# Patient Record
Sex: Female | Born: 1995 | Race: White | Hispanic: No | Marital: Single | Smoking: Current every day smoker
Health system: Southern US, Community
[De-identification: ages and names within clinical notes are randomized; demographics above are authoritative.]

## PROBLEM LIST (undated history)

## (undated) ENCOUNTER — Inpatient Hospital Stay (HOSPITAL_COMMUNITY): Payer: Self-pay

## (undated) DIAGNOSIS — G43909 Migraine, unspecified, not intractable, without status migrainosus: Secondary | ICD-10-CM

## (undated) DIAGNOSIS — G930 Cerebral cysts: Secondary | ICD-10-CM

## (undated) DIAGNOSIS — F419 Anxiety disorder, unspecified: Secondary | ICD-10-CM

## (undated) DIAGNOSIS — G935 Compression of brain: Secondary | ICD-10-CM

## (undated) DIAGNOSIS — O133 Gestational [pregnancy-induced] hypertension without significant proteinuria, third trimester: Secondary | ICD-10-CM

## (undated) DIAGNOSIS — I10 Essential (primary) hypertension: Secondary | ICD-10-CM

## (undated) HISTORY — DX: Essential (primary) hypertension: I10

## (undated) HISTORY — PX: NO PAST SURGERIES: SHX2092

## (undated) HISTORY — DX: Gestational (pregnancy-induced) hypertension without significant proteinuria, third trimester: O13.3

---

## 2017-08-16 ENCOUNTER — Inpatient Hospital Stay (HOSPITAL_COMMUNITY)
Admission: AD | Admit: 2017-08-16 | Discharge: 2017-08-17 | Disposition: A | Discharge status: Home / Self Care | Payer: BLUE CROSS/BLUE SHIELD | Source: Ambulatory Visit | Attending: Obstetrics and Gynecology | Admitting: Obstetrics and Gynecology

## 2017-08-16 ENCOUNTER — Encounter (HOSPITAL_COMMUNITY): Payer: Self-pay

## 2017-08-16 ENCOUNTER — Emergency Department (HOSPITAL_COMMUNITY): Payer: BLUE CROSS/BLUE SHIELD

## 2017-08-16 ENCOUNTER — Encounter (HOSPITAL_COMMUNITY): Payer: Self-pay | Admitting: Emergency Medicine

## 2017-08-16 ENCOUNTER — Emergency Department (HOSPITAL_COMMUNITY)
Admission: EM | Admit: 2017-08-16 | Discharge: 2017-08-17 | Disposition: A | Discharge status: Home / Self Care | Payer: Self-pay | Attending: Emergency Medicine | Admitting: Emergency Medicine

## 2017-08-16 DIAGNOSIS — F419 Anxiety disorder, unspecified: Secondary | ICD-10-CM | POA: Diagnosis not present

## 2017-08-16 DIAGNOSIS — Z3A01 Less than 8 weeks gestation of pregnancy: Secondary | ICD-10-CM | POA: Insufficient documentation

## 2017-08-16 DIAGNOSIS — O99331 Smoking (tobacco) complicating pregnancy, first trimester: Secondary | ICD-10-CM | POA: Insufficient documentation

## 2017-08-16 DIAGNOSIS — F17210 Nicotine dependence, cigarettes, uncomplicated: Secondary | ICD-10-CM | POA: Diagnosis not present

## 2017-08-16 DIAGNOSIS — Z3491 Encounter for supervision of normal pregnancy, unspecified, first trimester: Secondary | ICD-10-CM

## 2017-08-16 DIAGNOSIS — O208 Other hemorrhage in early pregnancy: Secondary | ICD-10-CM | POA: Insufficient documentation

## 2017-08-16 DIAGNOSIS — Z5321 Procedure and treatment not carried out due to patient leaving prior to being seen by health care provider: Secondary | ICD-10-CM | POA: Insufficient documentation

## 2017-08-16 DIAGNOSIS — O418X10 Other specified disorders of amniotic fluid and membranes, first trimester, not applicable or unspecified: Secondary | ICD-10-CM

## 2017-08-16 DIAGNOSIS — O468X1 Other antepartum hemorrhage, first trimester: Secondary | ICD-10-CM

## 2017-08-16 DIAGNOSIS — O99341 Other mental disorders complicating pregnancy, first trimester: Secondary | ICD-10-CM | POA: Diagnosis not present

## 2017-08-16 DIAGNOSIS — O209 Hemorrhage in early pregnancy, unspecified: Secondary | ICD-10-CM

## 2017-08-16 DIAGNOSIS — Z79899 Other long term (current) drug therapy: Secondary | ICD-10-CM | POA: Insufficient documentation

## 2017-08-16 HISTORY — DX: Anxiety disorder, unspecified: F41.9

## 2017-08-16 HISTORY — DX: Migraine, unspecified, not intractable, without status migrainosus: G43.909

## 2017-08-16 LAB — CBC
HCT: 39.9 % (ref 36.0–46.0)
Hemoglobin: 13.4 g/dL (ref 12.0–15.0)
MCH: 29 pg (ref 26.0–34.0)
MCHC: 33.6 g/dL (ref 30.0–36.0)
MCV: 86.4 fL (ref 78.0–100.0)
Platelets: 376 10*3/uL (ref 150–400)
RBC: 4.62 MIL/uL (ref 3.87–5.11)
RDW: 13.1 % (ref 11.5–15.5)
WBC: 12.4 10*3/uL — ABNORMAL HIGH (ref 4.0–10.5)

## 2017-08-16 LAB — I-STAT BETA HCG BLOOD, ED (MC, WL, AP ONLY): I-stat hCG, quantitative: >2000 m[IU]/mL — ABNORMAL HIGH (ref <5)

## 2017-08-16 NOTE — ED Triage Notes (Signed)
Patient states she is [redacted] weeks pregnant and began having vaginal bleeding around 7pm tonight. States small amount of blood on underwear. Denies any pain. First time being pregnant. Saw PCP on 5th and has not seen OB yet. appt scheduled on Monday.

## 2017-08-16 NOTE — MAU Note (Signed)
Pt reports red/pink vaginal bleeding that started tonight around 7pm. States it is like a period. Pt denies pain. Pt denies recent pelvic exam. Last intercourse several days ago. LMP 06/29/2017

## 2017-08-17 DIAGNOSIS — O209 Hemorrhage in early pregnancy, unspecified: Secondary | ICD-10-CM | POA: Diagnosis not present

## 2017-08-17 LAB — URINALYSIS, ROUTINE W REFLEX MICROSCOPIC
BILIRUBIN URINE: NEGATIVE
Glucose, UA: NEGATIVE mg/dL
Ketones, ur: NEGATIVE mg/dL
Leukocytes, UA: NEGATIVE
NITRITE: NEGATIVE
PROTEIN: 100 mg/dL — AB
Specific Gravity, Urine: 1.014 (ref 1.005–1.030)
pH: 6 (ref 5.0–8.0)

## 2017-08-17 LAB — ABO/RH: ABO/RH(D): AB POS

## 2017-08-17 LAB — HCG, QUANTITATIVE, PREGNANCY: HCG, BETA CHAIN, QUANT, S: 40706 m[IU]/mL — AB (ref <5)

## 2017-08-17 LAB — HIV ANTIBODY (ROUTINE TESTING W REFLEX): HIV Screen 4th Generation wRfx: NONREACTIVE

## 2017-08-17 NOTE — MAU Provider Note (Signed)
Chief Complaint: Vaginal Bleeding   None     SUBJECTIVE HPI: Charlene Schneider is a 22 y.o. G1P0 at [redacted]w[redacted]d by LMP who presents to maternity admissions reporting vaginal bleeding like a light period starting tonight. The bleeding is light red, and is not enough to soak a pad fully, but needs more than a panty liner. She denies recent pelvic exam or intercourse.  She has not tried any treatments. There are no other associated symptoms.     HPI  Past Medical History:  Diagnosis Date  . Anxiety   . Migraines    History reviewed. No pertinent surgical history. Social History   Socioeconomic History  . Marital status: Single    Spouse name: Not on file  . Number of children: Not on file  . Years of education: Not on file  . Highest education level: Not on file  Social Needs  . Financial resource strain: Not on file  . Food insecurity - worry: Not on file  . Food insecurity - inability: Not on file  . Transportation needs - medical: Not on file  . Transportation needs - non-medical: Not on file  Occupational History  . Not on file  Tobacco Use  . Smoking status: Current Every Day Smoker    Types: Cigarettes  . Smokeless tobacco: Never Used  Substance and Sexual Activity  . Alcohol use: No    Frequency: Never  . Drug use: No  . Sexual activity: Yes  Other Topics Concern  . Not on file  Social History Narrative  . Not on file   No current facility-administered medications on file prior to encounter.    Current Outpatient Medications on File Prior to Encounter  Medication Sig Dispense Refill  . Prenatal Vit-Fe Fumarate-FA (PRENATAL MULTIVITAMIN) TABS tablet Take 1 tablet by mouth daily at 12 noon.     No Known Allergies  ROS:  Review of Systems  Constitutional: Negative for chills, fatigue and fever.  Respiratory: Negative for shortness of breath.   Cardiovascular: Negative for chest pain.  Gastrointestinal: Negative for nausea and vomiting.  Genitourinary: Positive for  vaginal bleeding. Negative for difficulty urinating, dysuria, flank pain, pelvic pain, vaginal discharge and vaginal pain.  Neurological: Negative for dizziness and headaches.  Psychiatric/Behavioral: Negative.      I have reviewed patient's Past Medical Hx, Surgical Hx, Family Hx, Social Hx, medications and allergies.   Physical Exam   Patient Vitals for the past 24 hrs:  BP Temp Temp src Pulse Resp SpO2 Height Weight  08/17/17 0107 132/72 - - (!) 104 16 - - -  08/16/17 2233 131/85 98.7 F (37.1 C) Oral (!) 109 18 99 % - -  08/16/17 2226 - - - - - - 5\' 8"  (1.727 m) 189 lb (85.7 kg)   Constitutional: Well-developed, well-nourished female in no acute distress.  Cardiovascular: normal rate Respiratory: normal effort GI: Abd soft, non-tender. Pos BS x 4 MS: Extremities nontender, no edema, normal ROM Neurologic: Alert and oriented x 4.  GU: Neg CVAT.  PELVIC EXAM: Deferred, pt has scheduled OB appointment  LAB RESULTS Results for orders placed or performed during the hospital encounter of 08/16/17 (from the past 24 hour(s))  CBC     Status: Abnormal   Collection Time: 08/16/17 10:56 PM  Result Value Ref Range   WBC 12.4 (H) 4.0 - 10.5 K/uL   RBC 4.62 3.87 - 5.11 MIL/uL   Hemoglobin 13.4 12.0 - 15.0 g/dL   HCT 16.1 09.6 - 04.5 %  MCV 86.4 78.0 - 100.0 fL   MCH 29.0 26.0 - 34.0 pg   MCHC 33.6 30.0 - 36.0 g/dL   RDW 46.913.1 62.911.5 - 52.815.5 %   Platelets 376 150 - 400 K/uL  hCG, quantitative, pregnancy     Status: Abnormal   Collection Time: 08/16/17 10:56 PM  Result Value Ref Range   hCG, Beta Chain, Quant, S 40,706 (H) <5 mIU/mL  ABO/Rh     Status: None (Preliminary result)   Collection Time: 08/16/17 10:56 PM  Result Value Ref Range   ABO/RH(D)      AB POS Performed at [facility], [address] [place], [place] [address]     --/--/AB POS Performed at [facility], [address] [place], [place] [address]  [phone](02/22 2256)  IMAGING [place] Ob Comp Less 14  Wks  Result Date: 08/17/2017 CLINICAL DATA:  22 year old pregnant female with vaginal bleeding. LMP: 06/29/2017 corresponding to an estimated gestational age of [redacted] weeks, 6 days. EXAM: OBSTETRIC <14 WK US AND TRANSVAGINAL OB US TECHNIQUE: Both transabdominal and transvaginal ultrasound examinations were performed for complete evaluation of the gestation as well as the maternal uterus, adnexal regions, and pelvic cul-de-sac. Transvaginal technique was performed to assess early pregnancy. COMPARISON:  None. FINDINGS: Intrauterine gestational sac: Single intrauterine gestational sac. Yolk sac:  Seen Embryo:  Present Cardiac Activity: Detected Heart Rate: 140 bpm CRL: 9.6 mm   7 w   0 d                  US EDC: 04/04/2018 Subchorionic hemorrhage: Small subchorionic hemorrhage measuring 1.8 x 1.1 x 1.3 cm along the inferior gestational sac. Maternal uterus/adnexae: The maternal ovaries appear unremarkable. There is a small right ovarian corpus luteum. There is trace free fluid within the pelvis. IMPRESSION: Single live intrauterine pregnancy with an estimated gestational age of [redacted] weeks, 0 days. Small subchorionic hemorrhage Electronically Signed   By: Elgie CollardArash  Radparvar M.D.   On: 08/17/2017 00:38   [place] Ob Transvaginal  Result Date: 08/17/2017 CLINICAL DATA:  22 year old pregnant female with vaginal bleeding. LMP: 06/29/2017 corresponding to an estimated gestational age of [redacted] weeks, 6 days. EXAM: OBSTETRIC <14 WK US AND TRANSVAGINAL OB US TECHNIQUE: Both transabdominal and transvaginal ultrasound examinations were performed for complete evaluation of the gestation as well as the maternal uterus, adnexal regions, and pelvic cul-de-sac. Transvaginal technique was performed to assess early pregnancy. COMPARISON:  None. FINDINGS: Intrauterine gestational sac: Single intrauterine gestational sac. Yolk sac:  Seen Embryo:  Present Cardiac Activity: Detected Heart Rate: 140 bpm CRL: 9.6 mm   7 w   0 d                  US EDC:  04/04/2018 Subchorionic hemorrhage: Small subchorionic hemorrhage measuring 1.8 x 1.1 x 1.3 cm along the inferior gestational sac. Maternal uterus/adnexae: The maternal ovaries appear unremarkable. There is a small right ovarian corpus luteum. There is trace free fluid within the pelvis. IMPRESSION: Single live intrauterine pregnancy with an estimated gestational age of [redacted] weeks, 0 days. Small subchorionic hemorrhage Electronically Signed   By: Elgie CollardArash  Radparvar M.D.   On: 08/17/2017 00:38    MAU Management/MDM: Ordered labs and US and reviewed results.  Live IUP at 7 weeks noted on today's US with small subchorionic hemorrhage.  [facility], better prognosis with small SCH in first trimester.  Pt to keep scheduled OB care with [facility].  Return to MAU as needed for emergencies.  Pt discharged with strict bleeding precautions.  ASSESSMENT 1. Normal IUP (intrauterine pregnancy) on prenatal ultrasound, first trimester   2. Vaginal bleeding in pregnancy, first trimester   3. Subchorionic hemorrhage of placenta in first trimester, single or unspecified fetus     PLAN Discharge home Allergies as of 08/17/2017   No Known Allergies     Medication List    TAKE these medications   prenatal multivitamin Tabs tablet Take 1 tablet by mouth daily at 12 noon.      Follow-up Information    [facility], [facility] Follow up.   Why:  As scheduled, return to MAU as needed for emergencies Contact information: [address] [place] [address] [phone]           Sharen Counter Certified Nurse-Midwife 08/17/2017  1:08 AM

## 2017-08-17 NOTE — Discharge Instructions (Signed)
Subchorionic Hematoma °A subchorionic hematoma is a gathering of blood between the outer wall of the placenta and the inner wall of the womb (uterus). The placenta is the organ that connects the fetus to the wall of the uterus. The placenta performs the feeding, breathing (oxygen to the fetus), and waste removal (excretory work) of the fetus. °Subchorionic hematoma is the most common abnormality found on a result from ultrasonography done during the first trimester or early second trimester of pregnancy. If there has been little or no vaginal bleeding, early small hematomas usually shrink on their own and do not affect your baby or pregnancy. The blood is gradually absorbed over 1-2 weeks. When bleeding starts later in pregnancy or the hematoma is larger or occurs in an older pregnant woman, the outcome may not be as good. Larger hematomas may get bigger, which increases the chances for miscarriage. Subchorionic hematoma also increases the risk of premature detachment of the placenta from the uterus, preterm (premature) labor, and stillbirth. °Follow these instructions at home: °· Stay on bed rest if your health care provider recommends this. Although bed rest will not prevent more bleeding or prevent a miscarriage, your health care provider may recommend bed rest until you are advised otherwise. °· Avoid heavy lifting (more than 10 lb [4.5 kg]), exercise, sexual intercourse, or douching as directed by your health care provider. °· Keep track of the number of pads you use each day and how soaked (saturated) they are. Write down this information. °· Do not use tampons. °· Keep all follow-up appointments as directed by your health care provider. Your health care provider may ask you to have follow-up blood tests or ultrasound tests or both. °Get help right away if: °· You have severe cramps in your stomach, back, abdomen, or pelvis. °· You have a fever. °· You pass large clots or tissue. Save any tissue for your  health care provider to look at. °· Your bleeding increases or you become lightheaded, feel weak, or have fainting episodes. °This information is not intended to replace advice given to you by your health care provider. Make sure you discuss any questions you have with your health care provider. °Document Released: 09/26/2006 Document Revised: 11/17/2015 Document Reviewed: 01/08/2013 °Elsevier Interactive Patient Education © 2017 [employer] ° °

## 2017-09-09 LAB — OB RESULTS CONSOLE ABO/RH: RH Type: POSITIVE

## 2017-09-09 LAB — OB RESULTS CONSOLE HIV ANTIBODY (ROUTINE TESTING): HIV: NONREACTIVE

## 2017-09-09 LAB — OB RESULTS CONSOLE HEPATITIS B SURFACE ANTIGEN: HEP B S AG: NEGATIVE

## 2017-09-09 LAB — OB RESULTS CONSOLE GC/CHLAMYDIA
CHLAMYDIA, DNA PROBE: NEGATIVE
Gonorrhea: NEGATIVE

## 2017-09-09 LAB — OB RESULTS CONSOLE ANTIBODY SCREEN: Antibody Screen: NEGATIVE

## 2017-09-09 LAB — OB RESULTS CONSOLE RUBELLA ANTIBODY, IGM: Rubella: IMMUNE

## 2017-09-09 LAB — OB RESULTS CONSOLE RPR: RPR: NONREACTIVE

## 2017-09-30 LAB — OB RESULTS CONSOLE GBS: STREP GROUP B AG: POSITIVE

## 2017-12-11 ENCOUNTER — Encounter (HOSPITAL_COMMUNITY): Payer: Self-pay

## 2017-12-11 ENCOUNTER — Other Ambulatory Visit: Payer: Self-pay

## 2017-12-11 ENCOUNTER — Inpatient Hospital Stay (HOSPITAL_COMMUNITY)
Admission: AD | Admit: 2017-12-11 | Discharge: 2017-12-12 | Disposition: A | Discharge status: Home / Self Care | Payer: BLUE CROSS/BLUE SHIELD | Source: Ambulatory Visit | Attending: Obstetrics and Gynecology | Admitting: Obstetrics and Gynecology

## 2017-12-11 DIAGNOSIS — R51 Headache: Secondary | ICD-10-CM | POA: Insufficient documentation

## 2017-12-11 DIAGNOSIS — F17210 Nicotine dependence, cigarettes, uncomplicated: Secondary | ICD-10-CM | POA: Insufficient documentation

## 2017-12-11 DIAGNOSIS — F419 Anxiety disorder, unspecified: Secondary | ICD-10-CM | POA: Diagnosis not present

## 2017-12-11 DIAGNOSIS — O99332 Smoking (tobacco) complicating pregnancy, second trimester: Secondary | ICD-10-CM | POA: Insufficient documentation

## 2017-12-11 DIAGNOSIS — O99352 Diseases of the nervous system complicating pregnancy, second trimester: Secondary | ICD-10-CM | POA: Diagnosis present

## 2017-12-11 DIAGNOSIS — O26892 Other specified pregnancy related conditions, second trimester: Secondary | ICD-10-CM | POA: Insufficient documentation

## 2017-12-11 DIAGNOSIS — O9989 Other specified diseases and conditions complicating pregnancy, childbirth and the puerperium: Secondary | ICD-10-CM | POA: Diagnosis not present

## 2017-12-11 DIAGNOSIS — G43809 Other migraine, not intractable, without status migrainosus: Secondary | ICD-10-CM

## 2017-12-11 DIAGNOSIS — Z3A23 23 weeks gestation of pregnancy: Secondary | ICD-10-CM | POA: Diagnosis not present

## 2017-12-11 DIAGNOSIS — O99342 Other mental disorders complicating pregnancy, second trimester: Secondary | ICD-10-CM | POA: Diagnosis not present

## 2017-12-11 DIAGNOSIS — G43909 Migraine, unspecified, not intractable, without status migrainosus: Secondary | ICD-10-CM | POA: Diagnosis present

## 2017-12-11 LAB — PROTEIN / CREATININE RATIO, URINE: Creatinine, Urine: 29 mg/dL

## 2017-12-11 LAB — CBC
HEMATOCRIT: 36.6 % (ref 36.0–46.0)
HEMOGLOBIN: 12.2 g/dL (ref 12.0–15.0)
MCH: 29.1 pg (ref 26.0–34.0)
MCHC: 33.3 g/dL (ref 30.0–36.0)
MCV: 87.4 fL (ref 78.0–100.0)
Platelets: 345 10*3/uL (ref 150–400)
RBC: 4.19 MIL/uL (ref 3.87–5.11)
RDW: 13.2 % (ref 11.5–15.5)
WBC: 14.4 10*3/uL — ABNORMAL HIGH (ref 4.0–10.5)

## 2017-12-11 LAB — COMPREHENSIVE METABOLIC PANEL
ALK PHOS: 80 U/L (ref 38–126)
ALT: 28 U/L (ref 14–54)
ANION GAP: 10 (ref 5–15)
AST: 20 U/L (ref 15–41)
Albumin: 3.4 g/dL — ABNORMAL LOW (ref 3.5–5.0)
BILIRUBIN TOTAL: 0.4 mg/dL (ref 0.3–1.2)
BUN: 7 mg/dL (ref 6–20)
CALCIUM: 9.8 mg/dL (ref 8.9–10.3)
CO2: 22 mmol/L (ref 22–32)
CREATININE: 0.52 mg/dL (ref 0.44–1.00)
Chloride: 105 mmol/L (ref 101–111)
GFR calc non Af Amer: >60 mL/min (ref >60)
GLUCOSE: 83 mg/dL (ref 65–99)
Potassium: 4.1 mmol/L (ref 3.5–5.1)
Sodium: 137 mmol/L (ref 135–145)
TOTAL PROTEIN: 7.1 g/dL (ref 6.5–8.1)

## 2017-12-11 LAB — URINALYSIS, ROUTINE W REFLEX MICROSCOPIC
Bilirubin Urine: NEGATIVE
GLUCOSE, UA: NEGATIVE mg/dL
Hgb urine dipstick: NEGATIVE
Ketones, ur: NEGATIVE mg/dL
LEUKOCYTES UA: NEGATIVE
Nitrite: NEGATIVE
PH: 7 (ref 5.0–8.0)
Protein, ur: NEGATIVE mg/dL
SPECIFIC GRAVITY, URINE: 1.005 (ref 1.005–1.030)

## 2017-12-11 MED ORDER — METOCLOPRAMIDE HCL 5 MG/ML IJ SOLN
10.0000 mg | Freq: Once | INTRAMUSCULAR | Status: AC
  Administered 2017-12-11: 10 mg via INTRAVENOUS
  Filled 2017-12-11: qty 2

## 2017-12-11 MED ORDER — DEXAMETHASONE SODIUM PHOSPHATE 10 MG/ML IJ SOLN
10.0000 mg | Freq: Once | INTRAMUSCULAR | Status: AC
  Administered 2017-12-11: 10 mg via INTRAVENOUS
  Filled 2017-12-11: qty 1

## 2017-12-11 MED ORDER — LACTATED RINGERS IV SOLN
INTRAVENOUS | Status: DC
  Administered 2017-12-11 (×2): via INTRAVENOUS

## 2017-12-11 MED ORDER — HYDROMORPHONE HCL 1 MG/ML IJ SOLN
0.5000 mg | Freq: Once | INTRAMUSCULAR | Status: AC
  Administered 2017-12-11: 0.5 mg via INTRAVENOUS
  Filled 2017-12-11: qty 1

## 2017-12-11 MED ORDER — DIPHENHYDRAMINE HCL 50 MG/ML IJ SOLN
25.0000 mg | Freq: Once | INTRAMUSCULAR | Status: AC
  Administered 2017-12-11: 25 mg via INTRAVENOUS
  Filled 2017-12-11: qty 1

## 2017-12-11 NOTE — MAU Provider Note (Signed)
Chief Complaint:  Headache and Nausea   First Provider Initiated Contact with Patient 12/11/17 2114     HPI: Charlene Schneider is a 22 y.o. G1P0 at 423w4dwho presents to maternity admissions reporting migraine headache. Similar to migraines in past, mostly frontal.  Took Fioricet without relief.  Had some dizziness and nausea with it.  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 Has not been seen in 2 years for that. States are stable. She reports good fetal movement, denies LOF, vaginal bleeding, vaginal itching/burning, urinary symptoms, dizziness, n/v, diarrhea, constipation or fever/chills.  Had  Some scotoma, which is usual for her migraines.  Headache   This is a new problem. The current episode started yesterday. The problem occurs constantly. The problem has been unchanged. The pain is located in the frontal region. The pain does not radiate. The pain quality is similar to prior headaches. The quality of the pain is described as aching and dull. The pain is severe. Associated symptoms include dizziness, nausea and a visual change (scotoma). Pertinent negatives include no abdominal pain, anorexia, back pain, blurred vision, fever, muscle aches, neck pain, tingling or vomiting. The symptoms are aggravated by bright light. Treatments tried: Fioricet. The treatment provided no relief. Her past medical history is significant for migraine headaches.    RN note: Pt states that yesterday afternoon she started having a headache. She tried tylenol with no relief.  She started having nausea and dizziness today.  Denies vaginal bleeding or LOF.     Past Medical History: Past Medical History:  Diagnosis Date  . Anxiety   . Migraines     Past obstetric history: OB History  Gravida Para Term Preterm AB Living  1            SAB TAB Ectopic Multiple Live Births               # Outcome Date GA Lbr Len/2nd Weight Sex Delivery Anes PTL Lv  1 Current             Past Surgical  History: History reviewed. No pertinent surgical history.  Family History: History reviewed. No pertinent family history.  Social History: Social History   Tobacco Use  . Smoking status: Current Every Day Smoker    Types: Cigarettes  . Smokeless tobacco: Never Used  Substance Use Topics  . Alcohol use: No    Frequency: Never  . Drug use: No    Allergies: No Known Allergies  Meds:  Medications Prior to Admission  Medication Sig Dispense Refill Last Dose  . Prenatal Vit-Fe Fumarate-FA (PRENATAL MULTIVITAMIN) TABS tablet Take 1 tablet by mouth daily at 12 noon.   08/16/2017 at Unknown time    I have reviewed patient's Past Medical Hx, Surgical Hx, Family Hx, Social Hx, medications and allergies.   ROS:  Review of Systems  Constitutional: Negative for fever.  Eyes: Negative for blurred vision.  Gastrointestinal: Positive for nausea. Negative for abdominal pain, anorexia and vomiting.  Musculoskeletal: Negative for back pain and neck pain.  Neurological: Positive for dizziness and headaches. Negative for tingling.   Other systems negative  Physical Exam   Patient Vitals for the past 24 hrs:  BP Temp Temp src Pulse Resp SpO2 Weight  12/11/17 2102 (!) 146/75 98.6 F (37 C) Oral (!) 102 17 99 % 204 lb 0.2 oz (92.5 kg)   Vitals:   12/11/17 2155 12/11/17 2200 12/11/17 2230 12/11/17 2245  BP: 136/85 133/84 124/77 121/67  Pulse:  100 100 (!) 103 81  Resp:      Temp:      TempSrc:      SpO2:      Weight:        Constitutional: Well-developed, well-nourished female in no acute distress.  Cardiovascular: normal rate and rhythm Respiratory: normal effort, clear to auscultation bilaterally GI: Abd soft, non-tender, gravid appropriate for gestational age.   No rebound or guarding. MS: Extremities nontender, no edema, normal ROM Neurologic: Alert and oriented x 4.  GU: Neg CVAT.  PELVIC EXAM: deferred  FHT:  Baseline 140 , moderate variability, accelerations present, no  decelerations Contractions: none   Labs: --/--/AB POS Performed at [facility], [address], [place], [place] [address]  (02/22 2256) Results for orders placed or performed during the hospital encounter of 12/11/17 (from the past 24 hour(s))  Urinalysis, Routine w reflex microscopic     Status: Abnormal   Collection Time: 12/11/17  8:40 PM  Result Value Ref Range   Color, Urine STRAW (A) YELLOW   APPearance CLEAR CLEAR   Specific Gravity, Urine 1.005 1.005 - 1.030   pH 7.0 5.0 - 8.0   Glucose, UA NEGATIVE NEGATIVE mg/dL   Hgb urine dipstick NEGATIVE NEGATIVE   Bilirubin Urine NEGATIVE NEGATIVE   Ketones, ur NEGATIVE NEGATIVE mg/dL   Protein, ur NEGATIVE NEGATIVE mg/dL   Nitrite NEGATIVE NEGATIVE   Leukocytes, UA NEGATIVE NEGATIVE  Protein / creatinine ratio, urine     Status: None   Collection Time: 12/11/17  8:40 PM  Result Value Ref Range   Creatinine, Urine 29.00 mg/dL   Total Protein, Urine <6 mg/dL   Protein Creatinine Ratio        0.00 - 0.15 mg/mg[Cre]  CBC     Status: Abnormal   Collection Time: 12/11/17  9:50 PM  Result Value Ref Range   WBC 14.4 (H) 4.0 - 10.5 K/uL   RBC 4.19 3.87 - 5.11 MIL/uL   Hemoglobin 12.2 12.0 - 15.0 g/dL   HCT 60.4 54.0 - 98.1 %   MCV 87.4 78.0 - 100.0 fL   MCH 29.1 26.0 - 34.0 pg   MCHC 33.3 30.0 - 36.0 g/dL   RDW 19.1 47.8 - 29.5 %   Platelets 345 150 - 400 K/uL  Comprehensive metabolic panel     Status: Abnormal   Collection Time: 12/11/17  9:50 PM  Result Value Ref Range   Sodium 137 135 - 145 mmol/L   Potassium 4.1 3.5 - 5.1 mmol/L   Chloride 105 101 - 111 mmol/L   CO2 22 22 - 32 mmol/L   Glucose, Bld 83 65 - 99 mg/dL   BUN 7 6 - 20 mg/dL   Creatinine, Ser 6.21 0.44 - 1.00 mg/dL   Calcium 9.8 8.9 - 30.8 mg/dL   Total Protein 7.1 6.5 - 8.1 g/dL   Albumin 3.4 (L) 3.5 - 5.0 g/dL   AST 20 15 - 41 U/L   ALT 28 14 - 54 U/L   Alkaline Phosphatase 80 38 - 126 U/L   Total Bilirubin 0.4 0.3 - 1.2 mg/dL   GFR calc non Af  Amer >60 >60 mL/min   GFR calc Af Amer >60 >60 mL/min   Anion gap 10 5 - 15    Imaging:  No results found.  MAU Course/MDM: Had a few elevated BPs, unclear if due to pain or possible hypertension.  These improved over time, but I have ordered labs and reviewed results. Preeclampsia labs  are normal  NST reviewed and is normal, reassuring. Consult Dr Amado Nash with presentation, exam findings and test results. She recommends PIH labs and the usual migraine cocktail.   Treatments in MAU included IV, Reglan, Benadryl, and Decadron. These relieved pain a little but headache is mostly the same WIll try Dilaudid and add some oxygen.  .  After second dose of Dilaudid (0.5mg ), pain went to "4" Reconsulted Dr Amado Nash, who recommends One dose of Tylenol then d/c home   Assessment: Single IUP at [redacted]w[redacted]d Headache, likely migraine Labile BPs, normal Preeclampsia labs  Plan: Discharge home Rx Percocet #4 tabs for use at home Return if worsens Preterm Labor precautions and fetal kick counts Follow up in Office for prenatal visits and recheck  Encouraged to return here or to other Urgent Care/ED if she develops worsening of symptoms, increase in pain, fever, or other concerning symptoms.  Pt stable at time of discharge.  Wynelle Bourgeois CNM, MSN Certified Nurse-Midwife 12/11/2017 9:28 PM

## 2017-12-11 NOTE — MAU Note (Signed)
Pt states that yesterday afternoon she started having a headache. She tried tylenol with no relief.   She started having nausea and dizziness today.   Denies vaginal bleeding or LOF.

## 2017-12-12 DIAGNOSIS — O9989 Other specified diseases and conditions complicating pregnancy, childbirth and the puerperium: Secondary | ICD-10-CM | POA: Diagnosis not present

## 2017-12-12 DIAGNOSIS — G43809 Other migraine, not intractable, without status migrainosus: Secondary | ICD-10-CM

## 2017-12-12 MED ORDER — OXYCODONE-ACETAMINOPHEN 5-325 MG PO TABS: 1.0000 | ORAL_TABLET | Freq: Four times a day (QID) | ORAL | 0 refills | Status: DC | PRN

## 2017-12-12 MED ORDER — OXYCODONE-ACETAMINOPHEN 5-325 MG PO TABS
1.0000 | ORAL_TABLET | Freq: Four times a day (QID) | ORAL | 0 refills | Status: DC | PRN
Start: 2017-12-12 — End: 2017-12-12

## 2017-12-12 MED ORDER — HYDROMORPHONE HCL 1 MG/ML IJ SOLN
0.5000 mg | Freq: Once | INTRAMUSCULAR | Status: AC
  Administered 2017-12-12: 0.5 mg via INTRAVENOUS
  Filled 2017-12-12: qty 1

## 2017-12-12 MED ORDER — ACETAMINOPHEN 325 MG PO TABS
650.0000 mg | ORAL_TABLET | Freq: Once | ORAL | Status: AC
  Administered 2017-12-12: 650 mg via ORAL
  Filled 2017-12-12: qty 2

## 2017-12-12 NOTE — Discharge Instructions (Signed)
Recurrent Migraine Headache  Migraines are a type of headache, and they are usually stronger and more sudden than normal headaches (tension headaches). Migraines are characterized by an intense pulsing, throbbing pain that is usually only present on one side of the head. Sometimes, migraine headaches can cause nausea, vomiting, sensitivity to light and sound, and vision changes. Recurrent migraines keep coming back (recurring). A migraine can last from 4 hours up to 3 days.  What are the causes?  The exact cause of this condition is not known. However, a migraine may be caused when nerves in the brain become irritated and release chemicals that cause inflammation of blood vessels. This inflammation causes pain.  Certain things may also trigger migraines, such as:   A disruption in your regular eating and sleeping schedule.   Smoking.   Stress.   Menstruation.   Certain foods and drinks, such as:  ? Aged cheese.  ? Chocolate.  ? Alcohol.  ? Caffeine.  ? Foods or drinks that contain nitrates, glutamate, aspartame, MSG, or tyramine.   Lack of sleep.   Hunger.   Physical exertion.   Fatigue.   High altitude.   Weather changes.   Medicines, such as:  ? Nitroglycerin, which is used to treat chest pain.  ? Birth control pills.  ? Estrogen.  ? Some blood pressure medicines.    What are the signs or symptoms?  Symptoms of this condition vary for each person and may include:   Pain that is usually only present on one side of the head. In some cases, the pain may be on both sides of the head or around the head or neck.   Pulsating or throbbing pain.   Severe pain that prevents daily activities.   Pain that is aggravated by any physical activity.   Nausea, vomiting, or both.   Dizziness.   Pain with exposure to bright lights, loud noises, or activity.   General sensitivity to bright lights, loud noises, or smells.    Before you get a migraine, you may get warning signs that a migraine is coming (aura). An  aura may include:   Seeing flashing lights.   Seeing bright spots, halos, or zigzag lines.   Having tunnel vision or blurred vision.   Having numbness or a tingling feeling.   Having trouble talking.   Having muscle weakness.   Smelling a certain odor.    How is this diagnosed?  This condition is often diagnosed based on:   Your symptoms and medical history.   A physical exam.    You may also have tests, including:   A CT scan or MRI of your brain. These imaging tests cannot diagnose migraines, but they can help to rule out other causes of headaches.   Blood tests.    How is this treated?  This condition is treated with:   Medicines. These are used for:  ? Lessening pain and nausea.  ? Preventing recurrent migraines.   Lifestyle changes, such as changes to your diet or sleeping patterns.   Behavior therapy, such as relaxation training or biofeedback. Biofeedback is a treatment that involves teaching you to relax and use your brain to lower your heart rate and control your breathing.    Follow these instructions at home:  Medicines   Take over-the-counter and prescription medicines only as told by your health care provider.   Do not drive or use heavy machinery while taking prescription pain medicine.  Lifestyle   Do not   use any products that contain nicotine or tobacco, such as cigarettes and e-cigarettes. If you need help quitting, ask your health care provider.   Limit alcohol intake to no more than 1 drink a day for nonpregnant women and 2 drinks a day for men. One drink equals 12 oz of beer, 5 oz of wine, or 1 oz of hard liquor.   Get 7-9 hours of sleep each night, or the amount of sleep recommended by your health care provider.   Limit your stress. Talk with your health care provider if you need help with stress management.   Maintain a healthy weight. If you need help losing weight, ask your health care provider.   Exercise regularly. Aim for 150 minutes of moderate-intensity exercise  (walking, biking, yoga) or 75 minutes of vigorous exercise (running, circuit training, swimming) each week.  General instructions   Keep a journal to find out what triggers your migraine headaches so you can avoid these triggers. For example, write down:  ? What you eat and drink.  ? How much sleep you get.  ? Any change to your diet or medicines.   Lie down in a dark, quiet room when you have a migraine.   Try placing a cool towel over your head when you have a migraine.   Keep lights dim, if bright lights bother you and make your migraines worse.   Keep all follow-up visits as told by your health care provider. This is important.  Contact a health care provider if:   Your pain does not improve, even with medicine.   Your migraines continue to return, even with medicine.   You have a fever.   You have weight loss.  Get help right away if:   Your migraine becomes severe and medicine does not help.   You have a stiff neck.   You have a loss of vision.   You have muscle weakness or loss of muscle control.   You start losing your balance or have trouble walking.   You feel faint or you pass out.   You develop new, severe symptoms.   You start having abrupt severe headaches that last for a second or less, like a thunderclap.  Summary   Migraine headaches are usually stronger and more sudden than normal headaches (tension headaches). Migraines are characterized by an intense pulsing, throbbing pain that is usually only present on one side of the head.   The exact cause of this condition is not known. However, a migraine may be caused when nerves in the brain become irritated and release chemicals that cause inflammation of blood vessels.   Certain things may trigger migraines, such as changes to diet or sleeping patterns, smoking, certain foods, alcohol, stress, and certain medicines.   Sometimes, migraine headaches can cause nausea, vomiting, sensitivity to light and sound, and vision  changes.   Migraines are often diagnosed based on your symptoms, medical history, and a physical exam.  This information is not intended to replace advice given to you by your health care provider. Make sure you discuss any questions you have with your health care provider.  Document Released: 03/06/2001 Document Revised: 03/23/2016 Document Reviewed: 03/23/2016  Elsevier Interactive Patient Education  2018 [employer]

## 2017-12-28 ENCOUNTER — Encounter (HOSPITAL_COMMUNITY): Payer: Self-pay

## 2017-12-28 ENCOUNTER — Inpatient Hospital Stay (HOSPITAL_COMMUNITY)
Admission: AD | Admit: 2017-12-28 | Discharge: 2017-12-28 | Disposition: A | Discharge status: Home / Self Care | Payer: BLUE CROSS/BLUE SHIELD | Source: Ambulatory Visit | Attending: Obstetrics & Gynecology | Admitting: Obstetrics & Gynecology

## 2017-12-28 DIAGNOSIS — G43919 Migraine, unspecified, intractable, without status migrainosus: Secondary | ICD-10-CM | POA: Insufficient documentation

## 2017-12-28 DIAGNOSIS — G43019 Migraine without aura, intractable, without status migrainosus: Secondary | ICD-10-CM

## 2017-12-28 DIAGNOSIS — F17210 Nicotine dependence, cigarettes, uncomplicated: Secondary | ICD-10-CM | POA: Insufficient documentation

## 2017-12-28 DIAGNOSIS — O132 Gestational [pregnancy-induced] hypertension without significant proteinuria, second trimester: Secondary | ICD-10-CM | POA: Diagnosis not present

## 2017-12-28 DIAGNOSIS — Z3A26 26 weeks gestation of pregnancy: Secondary | ICD-10-CM | POA: Diagnosis not present

## 2017-12-28 DIAGNOSIS — O99332 Smoking (tobacco) complicating pregnancy, second trimester: Secondary | ICD-10-CM | POA: Diagnosis not present

## 2017-12-28 DIAGNOSIS — O26892 Other specified pregnancy related conditions, second trimester: Secondary | ICD-10-CM | POA: Insufficient documentation

## 2017-12-28 HISTORY — DX: Compression of brain: G93.5

## 2017-12-28 HISTORY — DX: Cerebral cysts: G93.0

## 2017-12-28 LAB — CBC
HCT: 34.1 % — ABNORMAL LOW (ref 36.0–46.0)
Hemoglobin: 11.5 g/dL — ABNORMAL LOW (ref 12.0–15.0)
MCH: 29.4 pg (ref 26.0–34.0)
MCHC: 33.7 g/dL (ref 30.0–36.0)
MCV: 87.2 fL (ref 78.0–100.0)
PLATELETS: 329 10*3/uL (ref 150–400)
RBC: 3.91 MIL/uL (ref 3.87–5.11)
RDW: 13.1 % (ref 11.5–15.5)
WBC: 13.7 10*3/uL — ABNORMAL HIGH (ref 4.0–10.5)

## 2017-12-28 LAB — URINALYSIS, ROUTINE W REFLEX MICROSCOPIC
BILIRUBIN URINE: NEGATIVE
GLUCOSE, UA: NEGATIVE mg/dL
HGB URINE DIPSTICK: NEGATIVE
KETONES UR: NEGATIVE mg/dL
Leukocytes, UA: NEGATIVE
Nitrite: NEGATIVE
PROTEIN: NEGATIVE mg/dL
Specific Gravity, Urine: 1.003 — ABNORMAL LOW (ref 1.005–1.030)
pH: 7 (ref 5.0–8.0)

## 2017-12-28 LAB — COMPREHENSIVE METABOLIC PANEL
ALT: 27 U/L (ref 0–44)
ANION GAP: 8 (ref 5–15)
AST: 22 U/L (ref 15–41)
Albumin: 3.2 g/dL — ABNORMAL LOW (ref 3.5–5.0)
Alkaline Phosphatase: 88 U/L (ref 38–126)
BUN: 6 mg/dL (ref 6–20)
CHLORIDE: 104 mmol/L (ref 98–111)
CO2: 20 mmol/L — ABNORMAL LOW (ref 22–32)
Calcium: 9 mg/dL (ref 8.9–10.3)
Creatinine, Ser: 0.47 mg/dL (ref 0.44–1.00)
GFR calc non Af Amer: >60 mL/min (ref >60)
Glucose, Bld: 89 mg/dL (ref 70–99)
POTASSIUM: 3.6 mmol/L (ref 3.5–5.1)
Sodium: 132 mmol/L — ABNORMAL LOW (ref 135–145)
Total Bilirubin: 0.3 mg/dL (ref 0.3–1.2)
Total Protein: 6.7 g/dL (ref 6.5–8.1)

## 2017-12-28 LAB — PROTEIN / CREATININE RATIO, URINE: CREATININE, URINE: 24 mg/dL

## 2017-12-28 MED ORDER — IBUPROFEN 600 MG PO TABS
600.0000 mg | ORAL_TABLET | Freq: Once | ORAL | Status: AC
  Administered 2017-12-28: 600 mg via ORAL
  Filled 2017-12-28: qty 1

## 2017-12-28 MED ORDER — HYDROMORPHONE HCL 1 MG/ML IJ SOLN
1.0000 mg | Freq: Once | INTRAMUSCULAR | Status: AC
  Administered 2017-12-28: 1 mg via INTRAVENOUS
  Filled 2017-12-28: qty 1

## 2017-12-28 MED ORDER — LACTATED RINGERS IV BOLUS
1000.0000 mL | Freq: Once | INTRAVENOUS | Status: AC
  Administered 2017-12-28: 1000 mL via INTRAVENOUS

## 2017-12-28 MED ORDER — DEXAMETHASONE SODIUM PHOSPHATE 10 MG/ML IJ SOLN
10.0000 mg | Freq: Once | INTRAMUSCULAR | Status: AC
  Administered 2017-12-28: 10 mg via INTRAVENOUS
  Filled 2017-12-28: qty 1

## 2017-12-28 MED ORDER — METOCLOPRAMIDE HCL 5 MG/ML IJ SOLN
10.0000 mg | Freq: Once | INTRAMUSCULAR | Status: AC
  Administered 2017-12-28: 10 mg via INTRAVENOUS
  Filled 2017-12-28: qty 2

## 2017-12-28 MED ORDER — DIPHENHYDRAMINE HCL 50 MG/ML IJ SOLN
25.0000 mg | Freq: Once | INTRAMUSCULAR | Status: AC
  Administered 2017-12-28: 25 mg via INTRAVENOUS
  Filled 2017-12-28: qty 1

## 2017-12-28 MED ORDER — OXYCODONE-ACETAMINOPHEN 5-325 MG PO TABS: 1.0000 | ORAL_TABLET | Freq: Four times a day (QID) | ORAL | 0 refills | Status: DC | PRN

## 2017-12-28 MED ORDER — CYCLOBENZAPRINE HCL 10 MG PO TABS
10.0000 mg | ORAL_TABLET | Freq: Once | ORAL | Status: AC
  Administered 2017-12-28: 10 mg via ORAL
  Filled 2017-12-28: qty 1

## 2017-12-28 NOTE — Discharge Instructions (Signed)
Migraine Headache A migraine headache is an intense, throbbing pain on one side or both sides of the head. Migraines may also cause other symptoms, such as nausea, vomiting, and sensitivity to light and noise. What are the causes? Doing or taking certain things may also trigger migraines, such as:  Alcohol.  Smoking.  Medicines, such as: ? Medicine used to treat chest pain (nitroglycerine). ? Birth control pills. ? Estrogen pills. ? Certain blood pressure medicines.  Aged cheeses, chocolate, or caffeine.  Foods or drinks that contain nitrates, glutamate, aspartame, or tyramine.  Physical activity.  Other things that may trigger a migraine include:  Menstruation.  Pregnancy.  Hunger.  Stress, lack of sleep, too much sleep, or fatigue.  Weather changes.  What increases the risk? The following factors may make you more likely to experience migraine headaches:  Age. Risk increases with age.  Family history of migraine headaches.  Being Caucasian.  Depression and anxiety.  Obesity.  Being a woman.  Having a hole in the heart (patent foramen ovale) or other heart problems.  What are the signs or symptoms? The main symptom of this condition is pulsating or throbbing pain. Pain may:  Happen in any area of the head, such as on one side or both sides.  Interfere with daily activities.  Get worse with physical activity.  Get worse with exposure to bright lights or loud noises.  Other symptoms may include:  Nausea.  Vomiting.  Dizziness.  General sensitivity to bright lights, loud noises, or smells.  Before you get a migraine, you may get warning signs that a migraine is developing (aura). An aura may include:  Seeing flashing lights or having blind spots.  Seeing bright spots, halos, or zigzag lines.  Having tunnel vision or blurred vision.  Having numbness or a tingling feeling.  Having trouble talking.  Having muscle weakness.  How is this  diagnosed? A migraine headache can be diagnosed based on:  Your symptoms.  A physical exam.  Tests, such as CT scan or MRI of the head. These imaging tests can help rule out other causes of headaches.  Taking fluid from the spine (lumbar puncture) and analyzing it (cerebrospinal fluid analysis, or CSF analysis).  How is this treated? A migraine headache is usually treated with medicines that:  Relieve pain.  Relieve nausea.  Prevent migraines from coming back.  Treatment may also include:  Acupuncture.  Lifestyle changes like avoiding foods that trigger migraines.  Follow these instructions at home: Medicines  Take over-the-counter and prescription medicines only as told by your health care provider.  Do not drive or use heavy machinery while taking prescription pain medicine.  To prevent or treat constipation while you are taking prescription pain medicine, your health care provider may recommend that you: ? Drink enough fluid to keep your urine clear or pale yellow. ? Take over-the-counter or prescription medicines. ? Eat foods that are high in fiber, such as fresh fruits and vegetables, whole grains, and beans. ? Limit foods that are high in fat and processed sugars, such as fried and sweet foods. Lifestyle  Avoid alcohol use.  Do not use any products that contain nicotine or tobacco, such as cigarettes and e-cigarettes. If you need help quitting, ask your health care provider.  Get at least 8 hours of sleep every night.  Limit your stress. General instructions   Keep a journal to find out what may trigger your migraine headaches. For example, write down: ? What you eat and   drink. ? How much sleep you get. ? Any change to your diet or medicines.  If you have a migraine: ? Avoid things that make your symptoms worse, such as bright lights. ? It may help to lie down in a dark, quiet room. ? Do not drive or use heavy machinery. ? Ask your health care provider  what activities are safe for you while you are experiencing symptoms.  Keep all follow-up visits as told by your health care provider. This is important. Contact a health care provider if:  You develop symptoms that are different or more severe than your usual migraine symptoms. Get help right away if:  Your migraine becomes severe.  You have a fever.  You have a stiff neck.  You have vision loss.  Your muscles feel weak or like you cannot control them.  You start to lose your balance often.  You develop trouble walking.  You faint. This information is not intended to replace advice given to you by your health care provider. Make sure you discuss any questions you have with your health care provider. Document Released: 06/11/2005 Document Revised: 12/30/2015 Document Reviewed: 11/28/2015 Elsevier Interactive Patient Education  2017 [employer]   

## 2017-12-28 NOTE — MAU Provider Note (Addendum)
History     CSN: [id number]  Arrival date and time: 12/28/17 1726   First Provider Initiated Contact with Patient 12/28/17 1811      Chief Complaint  Patient presents with  . Headache   HPI  Charlene Schneider is a 22 y.o. G1P0 at [redacted]w[redacted]d who presents with headache. History of migraines. She was seen in MAU last week and treated for migraine headache. Normally takes fioricet with relief. Last took fioricet this morning around 9 am. Has had no relief in headache. Describes as frontal & occipital headache that feels like throbbing and pressure. Rates pain 9/10. Endorses photophobia. Has vomited 3 times today and continues to feel nauseated. Reports blurred vision since last night. No floaters or epigastric pain.  No history of hypertension. Initially had elevated BPs during MAU visit last week. Elevated BPs resolved and PEC labs were negative.   OB History    Gravida  1   Para      Term      Preterm      AB      Living        SAB      TAB      Ectopic      Multiple      Live Births              Past Medical History:  Diagnosis Date  . Anxiety   . Arachnoid cyst   . Chiari malformation type I (HCC)   . Migraines     History reviewed. No pertinent surgical history.  Family History  Problem Relation Age of Onset  . Cirrhosis Maternal Grandmother     Social History   Tobacco Use  . Smoking status: Current Every Day Smoker    Types: Cigarettes  . Smokeless tobacco: Never Used  . Tobacco comment: 1 per day  Substance Use Topics  . Alcohol use: No    Frequency: Never  . Drug use: No    Allergies: No Known Allergies  Medications Prior to Admission  Medication Sig Dispense Refill Last Dose  . oxyCODONE-acetaminophen (PERCOCET/ROXICET) 5-325 MG tablet Take 1 tablet by mouth every 6 (six) hours as needed. 4 tablet 0   . Prenatal Vit-Fe Fumarate-FA (PRENATAL MULTIVITAMIN) TABS tablet Take 1 tablet by mouth daily at 12 noon.   08/16/2017 at Unknown time     Review of Systems  Constitutional: Negative.   Eyes: Positive for photophobia and visual disturbance.  Gastrointestinal: Positive for nausea and vomiting. Negative for abdominal pain and diarrhea.  Genitourinary: Negative.   Musculoskeletal: Negative for neck pain.  Neurological: Positive for headaches. Negative for syncope.   Physical Exam   Blood pressure (!) 117/54, pulse 92, temperature 98.3 F (36.8 C), temperature source Oral, resp. rate 18, weight 208 lb (94.3 kg), last menstrual period 06/29/2017, SpO2 99 %. Patient Vitals for the past 24 hrs:  BP Temp Temp src Pulse Resp SpO2 Weight  12/28/17 1916 (!) 117/54 - - 92 18 - -  12/28/17 1901 116/60 - - 97 - - -  12/28/17 1846 133/78 - - 100 - - -  12/28/17 1831 135/81 - - (!) 103 - - -  12/28/17 1816 (!) 143/92 - - (!) 106 - - -  12/28/17 1758 (!) 146/90 - - (!) 109 - - -  12/28/17 1744 (!) 150/89 98.3 F (36.8 C) Oral 97 17 99 % 208 lb (94.3 kg)    Physical Exam  Nursing note and vitals reviewed. Constitutional: She  is oriented to person, place, and time. She appears well-developed and well-nourished. No distress.  HENT:  Head: Normocephalic and atraumatic.  Eyes: Conjunctivae are normal. Right eye exhibits no discharge. Left eye exhibits no discharge. No scleral icterus.  Neck: Normal range of motion.  Cardiovascular: Normal rate, regular rhythm and normal heart sounds.  No murmur heard. Respiratory: Effort normal and breath sounds normal. No respiratory distress. She has no wheezes.  GI: Soft. There is no tenderness.  Neurological: She is alert and oriented to person, place, and time. She has normal reflexes.  No clonus  Skin: Skin is warm and dry. She is not diaphoretic.  Psychiatric: She has a normal mood and affect. Her behavior is normal. Judgment and thought content normal.    MAU Course  Procedures Results for orders placed or performed during the hospital encounter of 12/28/17 (from the past 24 hour(s))   Urinalysis, Routine w reflex microscopic     Status: Abnormal   Collection Time: 12/28/17  5:55 PM  Result Value Ref Range   Color, Urine YELLOW YELLOW   APPearance HAZY (A) CLEAR   Specific Gravity, Urine 1.003 (L) 1.005 - 1.030   pH 7.0 5.0 - 8.0   Glucose, UA NEGATIVE NEGATIVE mg/dL   Hgb urine dipstick NEGATIVE NEGATIVE   Bilirubin Urine NEGATIVE NEGATIVE   Ketones, ur NEGATIVE NEGATIVE mg/dL   Protein, ur NEGATIVE NEGATIVE mg/dL   Nitrite NEGATIVE NEGATIVE   Leukocytes, UA NEGATIVE NEGATIVE  Protein / creatinine ratio, urine     Status: None   Collection Time: 12/28/17  5:55 PM  Result Value Ref Range   Creatinine, Urine 24.00 mg/dL   Total Protein, Urine <6 mg/dL   Protein Creatinine Ratio        0.00 - 0.15 mg/mg[Cre]  CBC     Status: Abnormal   Collection Time: 12/28/17  6:41 PM  Result Value Ref Range   WBC 13.7 (H) 4.0 - 10.5 K/uL   RBC 3.91 3.87 - 5.11 MIL/uL   Hemoglobin 11.5 (L) 12.0 - 15.0 g/dL   HCT 96.034.1 (L) 45.436.0 - 09.846.0 %   MCV 87.2 78.0 - 100.0 fL   MCH 29.4 26.0 - 34.0 pg   MCHC 33.7 30.0 - 36.0 g/dL   RDW 11.913.1 14.711.5 - 82.915.5 %   Platelets 329 150 - 400 K/uL  Comprehensive metabolic panel     Status: Abnormal   Collection Time: 12/28/17  6:41 PM  Result Value Ref Range   Sodium 132 (L) 135 - 145 mmol/L   Potassium 3.6 3.5 - 5.1 mmol/L   Chloride 104 98 - 111 mmol/L   CO2 20 (L) 22 - 32 mmol/L   Glucose, Bld 89 70 - 99 mg/dL   BUN 6 6 - 20 mg/dL   Creatinine, Ser 5.620.47 0.44 - 1.00 mg/dL   Calcium 9.0 8.9 - 13.010.3 mg/dL   Total Protein 6.7 6.5 - 8.1 g/dL   Albumin 3.2 (L) 3.5 - 5.0 g/dL   AST 22 15 - 41 U/L   ALT 27 0 - 44 U/L   Alkaline Phosphatase 88 38 - 126 U/L   Total Bilirubin 0.3 0.3 - 1.2 mg/dL   GFR calc non Af Amer >60 >60 mL/min   GFR calc Af Amer >60 >60 mL/min   Anion gap 8 5 - 15    MDM NST:  Baseline: 145 bpm, Variability: Good {> 6 bpm), Accelerations: Non-reactive but appropriate for gestational age and Decelerations:  Absent  Elevated BPs,  none severe range. Will cycle BPs and order PEC labs.  IV headache cocktail given (decadron, reglan, benadryl). Pt reports headache is the same, but now rates 7/10. Will give ibuprofen and flexeril. Patient is now normotensive & PEC labs normal.   Care turned over to Venia Carbon, FNP Judeth Horn, NP 12/28/2017 8:15 PM  Resumed care of patient. Upon reassessment of the patient's HA, patient continues to rates her HA pain 6/10 down from 8/10 despite the HA cocktail and Flexeril & Ibuprofen. Discussed labs, tracing and BP readings with Dr. Juliene Pina. Dilaudid worked well for the patient in the past for intractable migraine. 1 mg Of dilaudid given IV, patient now rates her HA pain 3/10. Ok for DC per Dr. Juliene Pina. Will RX a few percocet and plan for neurology referral from [facility] office.   Assessment and Plan    A:  1. Intractable migraine without aura and without status migrainosus   2. Gestational hypertension, second trimester     P:  Discharge home in stable condition Patient left tonight with her significant other  Rx: Percocet # 4 no refill Return to MAU if symptoms worsen Follow up with OB; plan for neurology consult outpatient.   Duane Lope, NP' 12/28/2017 9:56 PM

## 2017-12-28 NOTE — MAU Note (Signed)
Charlene KaufmannMelissa Schneider is a 22 y.o. at 5266w0d here in MAU reporting:  +headache Has a history of migraines Last took Fioricet this morning with no relief  Pain score: 9/10  +vomiting +blurred vision Vitals:   12/28/17 1744  BP: (!) 150/89  Pulse: 97  Resp: 17  Temp: 98.3 F (36.8 C)  SpO2: 99%      Lab orders placed from triage: ua

## 2017-12-29 ENCOUNTER — Other Ambulatory Visit: Payer: Self-pay

## 2017-12-29 ENCOUNTER — Inpatient Hospital Stay (HOSPITAL_COMMUNITY)
Admission: AD | Admit: 2017-12-29 | Discharge: 2017-12-30 | Disposition: A | Discharge status: Home / Self Care | Payer: BLUE CROSS/BLUE SHIELD | Source: Ambulatory Visit | Attending: Obstetrics | Admitting: Obstetrics

## 2017-12-29 ENCOUNTER — Encounter (HOSPITAL_COMMUNITY): Payer: Self-pay | Admitting: *Deleted

## 2017-12-29 DIAGNOSIS — R030 Elevated blood-pressure reading, without diagnosis of hypertension: Secondary | ICD-10-CM | POA: Diagnosis not present

## 2017-12-29 DIAGNOSIS — R51 Headache: Secondary | ICD-10-CM | POA: Diagnosis present

## 2017-12-29 DIAGNOSIS — Z79891 Long term (current) use of opiate analgesic: Secondary | ICD-10-CM | POA: Insufficient documentation

## 2017-12-29 DIAGNOSIS — G935 Compression of brain: Secondary | ICD-10-CM | POA: Insufficient documentation

## 2017-12-29 DIAGNOSIS — F17210 Nicotine dependence, cigarettes, uncomplicated: Secondary | ICD-10-CM | POA: Insufficient documentation

## 2017-12-29 DIAGNOSIS — O212 Late vomiting of pregnancy: Secondary | ICD-10-CM | POA: Insufficient documentation

## 2017-12-29 DIAGNOSIS — Z79899 Other long term (current) drug therapy: Secondary | ICD-10-CM | POA: Diagnosis not present

## 2017-12-29 DIAGNOSIS — O99332 Smoking (tobacco) complicating pregnancy, second trimester: Secondary | ICD-10-CM | POA: Diagnosis not present

## 2017-12-29 DIAGNOSIS — Z3A26 26 weeks gestation of pregnancy: Secondary | ICD-10-CM | POA: Insufficient documentation

## 2017-12-29 DIAGNOSIS — Z8379 Family history of other diseases of the digestive system: Secondary | ICD-10-CM | POA: Diagnosis not present

## 2017-12-29 DIAGNOSIS — R1110 Vomiting, unspecified: Secondary | ICD-10-CM | POA: Diagnosis present

## 2017-12-29 DIAGNOSIS — O99352 Diseases of the nervous system complicating pregnancy, second trimester: Secondary | ICD-10-CM | POA: Insufficient documentation

## 2017-12-29 DIAGNOSIS — O9989 Other specified diseases and conditions complicating pregnancy, childbirth and the puerperium: Secondary | ICD-10-CM

## 2017-12-29 DIAGNOSIS — G43019 Migraine without aura, intractable, without status migrainosus: Secondary | ICD-10-CM | POA: Insufficient documentation

## 2017-12-29 LAB — CBC
HCT: 34.4 % — ABNORMAL LOW (ref 36.0–46.0)
HEMOGLOBIN: 11.4 g/dL — AB (ref 12.0–15.0)
MCH: 29.4 pg (ref 26.0–34.0)
MCHC: 33.1 g/dL (ref 30.0–36.0)
MCV: 88.7 fL (ref 78.0–100.0)
Platelets: 316 10*3/uL (ref 150–400)
RBC: 3.88 MIL/uL (ref 3.87–5.11)
RDW: 13.5 % (ref 11.5–15.5)
WBC: 13.6 10*3/uL — ABNORMAL HIGH (ref 4.0–10.5)

## 2017-12-29 LAB — URINALYSIS, ROUTINE W REFLEX MICROSCOPIC
Bilirubin Urine: NEGATIVE
Glucose, UA: NEGATIVE mg/dL
Hgb urine dipstick: NEGATIVE
KETONES UR: NEGATIVE mg/dL
LEUKOCYTES UA: NEGATIVE
Nitrite: NEGATIVE
Protein, ur: NEGATIVE mg/dL
Specific Gravity, Urine: 1.015 (ref 1.005–1.030)
pH: 6 (ref 5.0–8.0)

## 2017-12-29 LAB — COMPREHENSIVE METABOLIC PANEL
ALK PHOS: 80 U/L (ref 38–126)
ALT: 34 U/L (ref 0–44)
ANION GAP: 8 (ref 5–15)
AST: 27 U/L (ref 15–41)
Albumin: 3.1 g/dL — ABNORMAL LOW (ref 3.5–5.0)
BILIRUBIN TOTAL: 0.3 mg/dL (ref 0.3–1.2)
BUN: 5 mg/dL — ABNORMAL LOW (ref 6–20)
CALCIUM: 8.7 mg/dL — AB (ref 8.9–10.3)
CO2: 20 mmol/L — AB (ref 22–32)
Chloride: 105 mmol/L (ref 98–111)
Creatinine, Ser: 0.52 mg/dL (ref 0.44–1.00)
GFR calc non Af Amer: >60 mL/min (ref >60)
Glucose, Bld: 91 mg/dL (ref 70–99)
POTASSIUM: 3.6 mmol/L (ref 3.5–5.1)
SODIUM: 133 mmol/L — AB (ref 135–145)
TOTAL PROTEIN: 6.3 g/dL — AB (ref 6.5–8.1)

## 2017-12-29 LAB — PROTEIN / CREATININE RATIO, URINE
Creatinine, Urine: 98 mg/dL
PROTEIN CREATININE RATIO: 0.07 mg/mg{creat} (ref 0.00–0.15)
TOTAL PROTEIN, URINE: 7 mg/dL

## 2017-12-29 MED ORDER — CYCLOBENZAPRINE HCL 10 MG PO TABS
10.0000 mg | ORAL_TABLET | Freq: Once | ORAL | Status: AC
  Administered 2017-12-29: 10 mg via ORAL
  Filled 2017-12-29: qty 1

## 2017-12-29 MED ORDER — SODIUM CHLORIDE 0.9 % IV SOLN
INTRAVENOUS | Status: DC
  Administered 2017-12-29: 20:00:00 via INTRAVENOUS

## 2017-12-29 MED ORDER — ONDANSETRON 8 MG PO TBDP
8.0000 mg | ORAL_TABLET | Freq: Once | ORAL | Status: AC
  Administered 2017-12-29: 8 mg via ORAL
  Filled 2017-12-29: qty 1

## 2017-12-29 MED ORDER — HYDROMORPHONE HCL 1 MG/ML IJ SOLN
1.0000 mg | Freq: Once | INTRAMUSCULAR | Status: AC
  Administered 2017-12-29: 1 mg via INTRAVENOUS
  Filled 2017-12-29: qty 1

## 2017-12-29 MED ORDER — DEXAMETHASONE SODIUM PHOSPHATE 10 MG/ML IJ SOLN
10.0000 mg | Freq: Once | INTRAMUSCULAR | Status: AC
  Administered 2017-12-29: 10 mg via INTRAVENOUS
  Filled 2017-12-29: qty 1

## 2017-12-29 MED ORDER — IBUPROFEN 800 MG PO TABS
800.0000 mg | ORAL_TABLET | Freq: Once | ORAL | Status: AC
  Administered 2017-12-29: 800 mg via ORAL
  Filled 2017-12-29: qty 1

## 2017-12-29 MED ORDER — METOCLOPRAMIDE HCL 5 MG/ML IJ SOLN
10.0000 mg | Freq: Once | INTRAMUSCULAR | Status: AC
  Administered 2017-12-29: 10 mg via INTRAVENOUS
  Filled 2017-12-29: qty 2

## 2017-12-29 MED ORDER — DIPHENHYDRAMINE HCL 50 MG/ML IJ SOLN
25.0000 mg | Freq: Once | INTRAMUSCULAR | Status: AC
  Administered 2017-12-29: 25 mg via INTRAVENOUS
  Filled 2017-12-29: qty 1

## 2017-12-29 NOTE — MAU Note (Signed)
Pt reports that she was in MAU yesterday for h/a, blurred vision and vomiting. She was discharged home and her symptoms came on worse this morning.

## 2017-12-29 NOTE — MAU Provider Note (Addendum)
Chief Complaint:  Headache and Emesis   First Provider Initiated Contact with Patient 12/29/17 1938      HPI: Charlene Schneider is a 22 y.o. G1P0 at [id number] presents to maternity admissions reporting migraine. She was seen in MAU yesterday for same symptoms, reports going home feeling slightly better then this morning waking up to a worse migraine. She reports migraine being present in the frontal and occipital aspects. Rates pain 9/10- has taken percocet this morning with no relief. She reports migraine is associated with nausea and vomiting- reports vomiting twice today. She reports a history of migraines since 22yo- has been on multiple medications for migraine and has seen neurology in the past. She reports no pregnancy related concerns, no abdominal pain or cramping.  She reports good fetal movement, denies LOF, vaginal bleeding, vaginal itching/burning, or urinary symptoms.  Past Medical History: Past Medical History:  Diagnosis Date  . Anxiety   . Arachnoid cyst   . Chiari malformation type I (HCC)   . Migraines     Past obstetric history: OB History  Gravida Para Term Preterm AB Living  1            SAB TAB Ectopic Multiple Live Births               # Outcome Date GA Lbr Len/2nd Weight Sex Delivery Anes PTL Lv  1 Current             Past Surgical History: Past Surgical History:  Procedure Laterality Date  . NO PAST SURGERIES      Family History: Family History  Problem Relation Age of Onset  . Cirrhosis Maternal Grandmother     Social History: Social History   Tobacco Use  . Smoking status: Current Every Day Smoker    Types: Cigarettes  . Smokeless tobacco: Never Used  . Tobacco comment: 1 per day  Substance Use Topics  . Alcohol use: No    Frequency: Never  . Drug use: No    Allergies: No Known Allergies  Meds:  Medications Prior to Admission  Medication Sig Dispense Refill Last Dose  . oxyCODONE-acetaminophen (PERCOCET/ROXICET) 5-325 MG tablet Take  1 tablet by mouth every 6 (six) hours as needed. 4 tablet 0 12/29/2017 at 1200  . Prenatal Vit-Fe Fumarate-FA (PRENATAL MULTIVITAMIN) TABS tablet Take 1 tablet by mouth daily at 12 noon.   08/16/2017 at Unknown time    ROS:  Review of Systems  Respiratory: Negative.   Cardiovascular: Negative.   Gastrointestinal: Positive for nausea and vomiting. Negative for abdominal pain, constipation and diarrhea.  Genitourinary: Negative.   Musculoskeletal: Negative.   Neurological: Positive for headaches. Negative for dizziness, syncope, weakness and light-headedness.   I have reviewed patient's Past Medical Hx, Surgical Hx, Family Hx, Social Hx, medications and allergies.   Physical Exam   Patient Vitals for the past 24 hrs:  BP Temp Temp src Pulse Resp SpO2 Weight  12/30/17 0157 127/72 - - 80 18 - -  12/30/17 0124 113/62 - - 90 - - -  12/30/17 0120 - - - - - 97 % -  12/30/17 0111 (!) 114/58 - - 81 18 - -  12/30/17 0110 - - - - - 96 % -  12/30/17 0058 109/63 - - 84 18 97 % -  12/30/17 0046 (!) 120/59 - - 82 18 - -  12/29/17 2100 133/72 - - - - - -  12/29/17 2020 135/79 - - 99 - - -  12/29/17 1945 Marland Kitchen(!)  136/92 - - 98 - - -  12/29/17 1936 134/82 - - 93 - - -  12/29/17 1933 - - - - - - 208 lb (94.3 kg)  12/29/17 1925 139/80 - - 98 - - -  12/29/17 1915 (!) 146/83 98.9 F (37.2 C) Oral 98 18 98 % -   Constitutional: Well-developed, well-nourished female in no acute distress.  Cardiovascular: normal rate Respiratory: normal effort GI: Abd soft, non-tender, gravid appropriate for gestational age.  MS: Extremities nontender, no edema, normal ROM Neurologic: Alert and oriented x 4.  GU: Neg CVAT.  PELVIC EXAM: deferred  FHT:  Baseline 145 , minimal/moderate variability, accelerations present, no decelerations Contractions: none   Labs: Results for orders placed or performed during the hospital encounter of 12/29/17 (from the past 24 hour(s))  Urinalysis, Routine w reflex microscopic      Status: Abnormal   Collection Time: 12/29/17  7:27 PM  Result Value Ref Range   Color, Urine YELLOW YELLOW   APPearance HAZY (A) CLEAR   Specific Gravity, Urine 1.015 1.005 - 1.030   pH 6.0 5.0 - 8.0   Glucose, UA NEGATIVE NEGATIVE mg/dL   Hgb urine dipstick NEGATIVE NEGATIVE   Bilirubin Urine NEGATIVE NEGATIVE   Ketones, ur NEGATIVE NEGATIVE mg/dL   Protein, ur NEGATIVE NEGATIVE mg/dL   Nitrite NEGATIVE NEGATIVE   Leukocytes, UA NEGATIVE NEGATIVE  Protein / creatinine ratio, urine     Status: None   Collection Time: 12/29/17  7:27 PM  Result Value Ref Range   Creatinine, Urine 98.00 mg/dL   Total Protein, Urine 7 mg/dL   Protein Creatinine Ratio 0.07 0.00 - 0.15 mg/mg[Cre]  CBC     Status: Abnormal   Collection Time: 12/29/17  8:17 PM  Result Value Ref Range   WBC 13.6 (H) 4.0 - 10.5 K/uL   RBC 3.88 3.87 - 5.11 MIL/uL   Hemoglobin 11.4 (L) 12.0 - 15.0 g/dL   HCT 60.4 (L) 54.0 - 98.1 %   MCV 88.7 78.0 - 100.0 fL   MCH 29.4 26.0 - 34.0 pg   MCHC 33.1 30.0 - 36.0 g/dL   RDW 19.1 47.8 - 29.5 %   Platelets 316 150 - 400 K/uL  Comprehensive metabolic panel     Status: Abnormal   Collection Time: 12/29/17  8:17 PM  Result Value Ref Range   Sodium 133 (L) 135 - 145 mmol/L   Potassium 3.6 3.5 - 5.1 mmol/L   Chloride 105 98 - 111 mmol/L   CO2 20 (L) 22 - 32 mmol/L   Glucose, Bld 91 70 - 99 mg/dL   BUN 5 (L) 6 - 20 mg/dL   Creatinine, Ser 6.21 0.44 - 1.00 mg/dL   Calcium 8.7 (L) 8.9 - 10.3 mg/dL   Total Protein 6.3 (L) 6.5 - 8.1 g/dL   Albumin 3.1 (L) 3.5 - 5.0 g/dL   AST 27 15 - 41 U/L   ALT 34 0 - 44 U/L   Alkaline Phosphatase 80 38 - 126 U/L   Total Bilirubin 0.3 0.3 - 1.2 mg/dL   GFR calc non Af Amer >60 >60 mL/min   GFR calc Af Amer >60 >60 mL/min   Anion gap 8 5 - 15   --/--/AB POS Performed at [facility], [address], [place], [place] [address]  (02/22 2256)  MAU Course/MDM: Orders Placed This Encounter  Procedures  . Urinalysis, Routine w reflex  microscopic  . Insert peripheral IV    Meds ordered this encounter  Medications  . AND Linked Order Group   . 0.9 %  sodium chloride infusion   . diphenhydrAMINE (BENADRYL) injection 25 mg   . metoCLOPramide (REGLAN) injection 10 mg   . dexamethasone (DECADRON) injection 10 mg  . HYDROmorphone (DILAUDID) injection 1 mg  . cyclobenzaprine (FLEXERIL) tablet 10 mg  . ibuprofen (ADVIL,MOTRIN) tablet 800 mg  . ondansetron (ZOFRAN-ODT) disintegrating tablet 8 mg  . sodium chloride 0.9 % 500 mL with magnesium sulfate 2 g infusion  . Magnesium 500 MG CAPS    Sig: Take 1 capsule (500 mg total) by mouth daily.    Dispense:  30 capsule    Refill:  0    Order Specific Question:   Supervising Provider    Answer:   Conan Bowens [[id number]]  . ondansetron (ZOFRAN ODT) 4 MG disintegrating tablet    Sig: Take 1 tablet (4 mg total) by mouth every 8 (eight) hours as needed for nausea or vomiting.    Dispense:  15 tablet    Refill:  0    Order Specific Question:   Supervising Provider    Answer:   Conan Bowens [[id number]]  . promethazine (PHENERGAN) 25 MG tablet    Sig: Take 1 tablet (25 mg total) by mouth every 6 (six) hours as needed for nausea or vomiting.    Dispense:  30 tablet    Refill:  0    Order Specific Question:   Supervising Provider    Answer:   Conan Bowens [[id number]]   NST reviewed- reactive for gestational age UA- negative  Treatments in MAU included HA cocktail, including decadron, reglan and benadryl.   Care taken over by Judeth Horn NP @2001     Steward Drone Certified Nurse-Midwife 12/29/2017 8:01 PM   Elevated BPs resolved and PEC labs negative  After headache cocktail, pt reports headache is 7/10. Given IV dilaudid as that has helped during previous visits -- states pain barely better and now rates 6/10. Discussed with Dr. Ernestina Penna & will give patient dose of flexeril & ibuprofen. Pain unchanged with flexeril & ibuprofen.   C/w neurology (Dr. Laurence Slate). Will give  mag sulfate 2 gm bolus in 500 cc of NS. If pain down to 5/10 or below, ok to discharge home. Pt to d/c fioricet & percocet. Recommends PO magnesium supplement of 500 mg QD for migraine prevention & pt to f/u with outpatient neuro consult. If symptoms don't improve tonight will proceed with imaging. Dr. Ernestina Penna updated with plan.   Patient reports improvement s/p mag sulfate. Now rating headache 4/10. Will d/c home.  A/P 1. Intractable migraine without aura and without status migrainosus  -d/c fioricet & percocet -Rx magnesium 500 mg QD -call ob office in morning to set up neurology consult  2. [redacted] weeks gestation of pregnancy   3. Elevated BP without diagnosis of hypertension  -BPs normalized & preeclampsia ruled out. Elevated BP d/t white coat syndrome vs pain vs gestational hypertension    Judeth Horn, NP

## 2017-12-30 DIAGNOSIS — G43019 Migraine without aura, intractable, without status migrainosus: Secondary | ICD-10-CM | POA: Diagnosis not present

## 2017-12-30 MED ORDER — MAGNESIUM 500 MG PO CAPS: 500.0000 mg | ORAL_CAPSULE | Freq: Every day | ORAL | 0 refills | Status: AC

## 2017-12-30 MED ORDER — PROMETHAZINE HCL 25 MG PO TABS: 25.0000 mg | ORAL_TABLET | Freq: Four times a day (QID) | ORAL | 0 refills | Status: AC | PRN

## 2017-12-30 MED ORDER — ONDANSETRON 4 MG PO TBDP: 4.0000 mg | ORAL_TABLET | Freq: Three times a day (TID) | ORAL | 0 refills | Status: DC | PRN

## 2017-12-30 MED ORDER — SODIUM CHLORIDE 0.9 % IV SOLN
Freq: Once | INTRAVENOUS | Status: AC
  Administered 2017-12-30: 01:00:00 via INTRAVENOUS
  Filled 2017-12-30: qty 500

## 2017-12-30 NOTE — Discharge Instructions (Signed)
Stop taking percocet & fioricet     Migraine Headache A migraine headache is an intense, throbbing pain on one side or both sides of the head. Migraines may also cause other symptoms, such as nausea, vomiting, and sensitivity to light and noise. What are the causes? Doing or taking certain things may also trigger migraines, such as:  Alcohol.  Smoking.  Medicines, such as: ? Medicine used to treat chest pain (nitroglycerine). ? Birth control pills. ? Estrogen pills. ? Certain blood pressure medicines.  Aged cheeses, chocolate, or caffeine.  Foods or drinks that contain nitrates, glutamate, aspartame, or tyramine.  Physical activity.  Other things that may trigger a migraine include:  Menstruation.  Pregnancy.  Hunger.  Stress, lack of sleep, too much sleep, or fatigue.  Weather changes.  What increases the risk? The following factors may make you more likely to experience migraine headaches:  Age. Risk increases with age.  Family history of migraine headaches.  Being Caucasian.  Depression and anxiety.  Obesity.  Being a woman.  Having a hole in the heart (patent foramen ovale) or other heart problems.  What are the signs or symptoms? The main symptom of this condition is pulsating or throbbing pain. Pain may:  Happen in any area of the head, such as on one side or both sides.  Interfere with daily activities.  Get worse with physical activity.  Get worse with exposure to bright lights or loud noises.  Other symptoms may include:  Nausea.  Vomiting.  Dizziness.  General sensitivity to bright lights, loud noises, or smells.  Before you get a migraine, you may get warning signs that a migraine is developing (aura). An aura may include:  Seeing flashing lights or having blind spots.  Seeing bright spots, halos, or zigzag lines.  Having tunnel vision or blurred vision.  Having numbness or a tingling feeling.  Having trouble  talking.  Having muscle weakness.  How is this diagnosed? A migraine headache can be diagnosed based on:  Your symptoms.  A physical exam.  Tests, such as CT scan or MRI of the head. These imaging tests can help rule out other causes of headaches.  Taking fluid from the spine (lumbar puncture) and analyzing it (cerebrospinal fluid analysis, or CSF analysis).  How is this treated? A migraine headache is usually treated with medicines that:  Relieve pain.  Relieve nausea.  Prevent migraines from coming back.  Treatment may also include:  Acupuncture.  Lifestyle changes like avoiding foods that trigger migraines.  Follow these instructions at home: Medicines  Take over-the-counter and prescription medicines only as told by your health care provider.  Do not drive or use heavy machinery while taking prescription pain medicine.  To prevent or treat constipation while you are taking prescription pain medicine, your health care provider may recommend that you: ? Drink enough fluid to keep your urine clear or pale yellow. ? Take over-the-counter or prescription medicines. ? Eat foods that are high in fiber, such as fresh fruits and vegetables, whole grains, and beans. ? Limit foods that are high in fat and processed sugars, such as fried and sweet foods. Lifestyle  Avoid alcohol use.  Do not use any products that contain nicotine or tobacco, such as cigarettes and e-cigarettes. If you need help quitting, ask your health care provider.  Get at least 8 hours of sleep every night.  Limit your stress. General instructions   Keep a journal to find out what may trigger your migraine headaches.  For example, write down: ? What you eat and drink. ? How much sleep you get. ? Any change to your diet or medicines.  If you have a migraine: ? Avoid things that make your symptoms worse, such as bright lights. ? It may help to lie down in a dark, quiet room. ? Do not drive or  use heavy machinery. ? Ask your health care provider what activities are safe for you while you are experiencing symptoms.  Keep all follow-up visits as told by your health care provider. This is important. Contact a health care provider if:  You develop symptoms that are different or more severe than your usual migraine symptoms. Get help right away if:  Your migraine becomes severe.  You have a fever.  You have a stiff neck.  You have vision loss.  Your muscles feel weak or like you cannot control them.  You start to lose your balance often.  You develop trouble walking.  You faint. This information is not intended to replace advice given to you by your health care provider. Make sure you discuss any questions you have with your health care provider. Document Released: 06/11/2005 Document Revised: 12/30/2015 Document Reviewed: 11/28/2015 Elsevier Interactive Patient Education  2017 [employer]

## 2018-02-12 ENCOUNTER — Inpatient Hospital Stay (HOSPITAL_COMMUNITY)
Admission: AD | Admit: 2018-02-12 | Discharge: 2018-02-12 | Disposition: A | Discharge status: Home / Self Care | Payer: BLUE CROSS/BLUE SHIELD | Source: Ambulatory Visit | Attending: Obstetrics & Gynecology | Admitting: Obstetrics & Gynecology

## 2018-02-12 ENCOUNTER — Encounter (HOSPITAL_COMMUNITY): Payer: Self-pay

## 2018-02-12 DIAGNOSIS — G43809 Other migraine, not intractable, without status migrainosus: Secondary | ICD-10-CM | POA: Diagnosis not present

## 2018-02-12 DIAGNOSIS — Z3A32 32 weeks gestation of pregnancy: Secondary | ICD-10-CM | POA: Diagnosis not present

## 2018-02-12 DIAGNOSIS — O26893 Other specified pregnancy related conditions, third trimester: Secondary | ICD-10-CM | POA: Insufficient documentation

## 2018-02-12 DIAGNOSIS — R030 Elevated blood-pressure reading, without diagnosis of hypertension: Secondary | ICD-10-CM | POA: Diagnosis present

## 2018-02-12 DIAGNOSIS — F17210 Nicotine dependence, cigarettes, uncomplicated: Secondary | ICD-10-CM | POA: Insufficient documentation

## 2018-02-12 DIAGNOSIS — O99333 Smoking (tobacco) complicating pregnancy, third trimester: Secondary | ICD-10-CM | POA: Insufficient documentation

## 2018-02-12 DIAGNOSIS — O9989 Other specified diseases and conditions complicating pregnancy, childbirth and the puerperium: Secondary | ICD-10-CM

## 2018-02-12 DIAGNOSIS — O133 Gestational [pregnancy-induced] hypertension without significant proteinuria, third trimester: Secondary | ICD-10-CM | POA: Insufficient documentation

## 2018-02-12 LAB — CBC
HCT: 35.2 % — ABNORMAL LOW (ref 36.0–46.0)
Hemoglobin: 11.8 g/dL — ABNORMAL LOW (ref 12.0–15.0)
MCH: 29 pg (ref 26.0–34.0)
MCHC: 33.5 g/dL (ref 30.0–36.0)
MCV: 86.5 fL (ref 78.0–100.0)
PLATELETS: 322 10*3/uL (ref 150–400)
RBC: 4.07 MIL/uL (ref 3.87–5.11)
RDW: 13 % (ref 11.5–15.5)
WBC: 13.1 10*3/uL — ABNORMAL HIGH (ref 4.0–10.5)

## 2018-02-12 LAB — COMPREHENSIVE METABOLIC PANEL
ALT: 22 U/L (ref 0–44)
AST: 16 U/L (ref 15–41)
Albumin: 3 g/dL — ABNORMAL LOW (ref 3.5–5.0)
Alkaline Phosphatase: 120 U/L (ref 38–126)
Anion gap: 8 (ref 5–15)
BUN: 7 mg/dL (ref 6–20)
CHLORIDE: 106 mmol/L (ref 98–111)
CO2: 21 mmol/L — ABNORMAL LOW (ref 22–32)
CREATININE: 0.47 mg/dL (ref 0.44–1.00)
Calcium: 8.9 mg/dL (ref 8.9–10.3)
GFR calc Af Amer: >60 mL/min (ref >60)
Glucose, Bld: 111 mg/dL — ABNORMAL HIGH (ref 70–99)
POTASSIUM: 3.7 mmol/L (ref 3.5–5.1)
Sodium: 135 mmol/L (ref 135–145)
Total Bilirubin: 0.3 mg/dL (ref 0.3–1.2)
Total Protein: 6.9 g/dL (ref 6.5–8.1)

## 2018-02-12 LAB — PROTEIN / CREATININE RATIO, URINE
Creatinine, Urine: 98 mg/dL
PROTEIN CREATININE RATIO: 0.13 mg/mg{creat} (ref 0.00–0.15)
Total Protein, Urine: 13 mg/dL

## 2018-02-12 LAB — URIC ACID: URIC ACID, SERUM: 3 mg/dL (ref 2.5–7.1)

## 2018-02-12 MED ORDER — METOCLOPRAMIDE HCL 5 MG/ML IJ SOLN
10.0000 mg | Freq: Once | INTRAMUSCULAR | Status: AC
  Administered 2018-02-12: 10 mg via INTRAVENOUS
  Filled 2018-02-12: qty 2

## 2018-02-12 MED ORDER — DIPHENHYDRAMINE HCL 50 MG/ML IJ SOLN
12.5000 mg | Freq: Once | INTRAMUSCULAR | Status: AC
  Administered 2018-02-12: 12.5 mg via INTRAVENOUS
  Filled 2018-02-12: qty 1

## 2018-02-12 MED ORDER — HYDROMORPHONE HCL 1 MG/ML IJ SOLN
1.0000 mg | Freq: Once | INTRAMUSCULAR | Status: AC
  Administered 2018-02-12: 1 mg via INTRAVENOUS
  Filled 2018-02-12: qty 1

## 2018-02-12 MED ORDER — DEXAMETHASONE SODIUM PHOSPHATE 10 MG/ML IJ SOLN
10.0000 mg | Freq: Once | INTRAMUSCULAR | Status: AC
  Administered 2018-02-12: 10 mg via INTRAVENOUS
  Filled 2018-02-12: qty 1

## 2018-02-12 MED ORDER — LABETALOL HCL 100 MG PO TABS: 100.0000 mg | ORAL_TABLET | Freq: Three times a day (TID) | ORAL | 0 refills | Status: DC

## 2018-02-12 MED ORDER — SODIUM CHLORIDE 0.9 % IV SOLN
INTRAVENOUS | Status: DC
  Administered 2018-02-12: 13:00:00 via INTRAVENOUS

## 2018-02-12 MED ORDER — OXYCODONE-ACETAMINOPHEN 5-325 MG PO TABS
2.0000 | ORAL_TABLET | Freq: Once | ORAL | Status: AC
  Administered 2018-02-12: 2 via ORAL
  Filled 2018-02-12: qty 2

## 2018-02-12 NOTE — Discharge Instructions (Signed)
Hypertension During Pregnancy °Hypertension, commonly called high blood pressure, is when the force of blood pumping through your arteries is too strong. Arteries are blood vessels that carry blood from the heart throughout the body. Hypertension during pregnancy can cause problems for you and your baby. Your baby may be born early (prematurely) or may not weigh as much as he or she should at birth. Very bad cases of hypertension during pregnancy can be life-threatening. °Different types of hypertension can occur during pregnancy. These include: °· Chronic hypertension. This happens when: °? You have hypertension before pregnancy and it continues during pregnancy. °? You develop hypertension before you are [redacted] weeks pregnant, and it continues during pregnancy. °· Gestational hypertension. This is hypertension that develops after the 20th week of pregnancy. °· Preeclampsia, also called toxemia of pregnancy. This is a very serious type of hypertension that develops only during pregnancy. It affects the whole body, and it can be very dangerous for you and your baby. ° °Gestational hypertension and preeclampsia usually go away within 6 weeks after your baby is born. Women who have hypertension during pregnancy have a greater chance of developing hypertension later in life or during future pregnancies. °What are the causes? °The exact cause of hypertension is not known. °What increases the risk? °There are certain factors that make it more likely for you to develop hypertension during pregnancy. These include: °· Having hypertension during a previous pregnancy or prior to pregnancy. °· Being overweight. °· Being older than age 40. °· Being pregnant for the first time or being pregnant with more than one baby. °· Becoming pregnant using fertilization methods such as IVF (in vitro fertilization). °· Having diabetes, kidney problems, or systemic lupus erythematosus. °· Having a family history of hypertension. ° °What are the  signs or symptoms? °Chronic hypertension and gestational hypertension rarely cause symptoms. Preeclampsia causes symptoms, which may include: °· Increased protein in your urine. Your health care provider will check for this at every visit before you give birth (prenatal visit). °· Severe headaches. °· Sudden weight gain. °· Swelling of the hands, face, legs, and feet. °· Nausea and vomiting. °· Vision problems, such as blurred or double vision. °· Numbness in the face, arms, legs, and feet. °· Dizziness. °· Slurred speech. °· Sensitivity to bright lights. °· Abdominal pain. °· Convulsions. ° °How is this diagnosed? °You may be diagnosed with hypertension during a routine prenatal exam. At each prenatal visit, you may: °· Have a urine test to check for high amounts of protein in your urine. °· Have your blood pressure checked. A blood pressure reading is recorded as two numbers, such as "120 over 80" (or 120/80). The first ("top") number is called the systolic pressure. It is a measure of the pressure in your arteries when your heart beats. The second ("bottom") number is called the diastolic pressure. It is a measure of the pressure in your arteries as your heart relaxes between beats. Blood pressure is measured in a unit called mm Hg. A normal blood pressure reading is: °? Systolic: below 120. °? Diastolic: below 80. ° °The type of hypertension that you are diagnosed with depends on your test results and when your symptoms developed. °· Chronic hypertension is usually diagnosed before 20 weeks of pregnancy. °· Gestational hypertension is usually diagnosed after 20 weeks of pregnancy. °· Hypertension with high amounts of protein in the urine is diagnosed as preeclampsia. °· Blood pressure measurements that stay above 160 systolic, or above 110 diastolic, are   signs of severe preeclampsia. ° °How is this treated? °Treatment for hypertension during pregnancy varies depending on the type of hypertension you have and how  serious it is. °· If you take medicines called ACE inhibitors to treat chronic hypertension, you may need to switch medicines. ACE inhibitors should not be taken during pregnancy. °· If you have gestational hypertension, you may need to take blood pressure medicine. °· If you are at risk for preeclampsia, your health care provider may recommend that you take a low-dose aspirin every day to prevent high blood pressure during your pregnancy. °· If you have severe preeclampsia, you may need to be hospitalized so you and your baby can be monitored closely. You may also need to take medicine (magnesium sulfate) to prevent seizures and to lower blood pressure. This medicine may be given as an injection or through an IV tube. °· In some cases, if your condition gets worse, you may need to deliver your baby early. ° °Follow these instructions at home: °Eating and drinking °· Drink enough fluid to keep your urine clear or pale yellow. °· Eat a healthy diet that is low in salt (sodium). Do not add salt to your food. Check food labels to see how much sodium a food or beverage contains. °Lifestyle °· Do not use any products that contain nicotine or tobacco, such as cigarettes and e-cigarettes. If you need help quitting, ask your health care provider. °· Do not use alcohol. °· Avoid caffeine. °· Avoid stress as much as possible. Rest and get plenty of sleep. °General instructions °· Take over-the-counter and prescription medicines only as told by your health care provider. °· While lying down, lie on your left side. This keeps pressure off your baby. °· While sitting or lying down, raise (elevate) your feet. Try putting some pillows under your lower legs. °· Exercise regularly. Ask your health care provider what kinds of exercise are best for you. °· Keep all prenatal and follow-up visits as told by your health care provider. This is important. °Contact a health care provider if: °· You have symptoms that your health care  provider told you may require more treatment or monitoring, such as: °? Fever. °? Vomiting. °? Headache. °Get help right away if: °· You have severe abdominal pain or vomiting that does not get better with treatment. °· You suddenly develop swelling in your hands, ankles, or face. °· You gain 4 lbs (1.8 kg) or more in 1 week. °· You develop vaginal bleeding, or you have blood in your urine. °· You do not feel your baby moving as much as usual. °· You have blurred or double vision. °· You have muscle twitching or sudden tightening (spasms). °· You have shortness of breath. °· Your lips or fingernails turn blue. °This information is not intended to replace advice given to you by your health care provider. Make sure you discuss any questions you have with your health care provider. °Document Released: 02/27/2011 Document Revised: 12/30/2015 Document Reviewed: 11/25/2015 °Elsevier Interactive Patient Education © 2018 [employer] Inc[employer] ° °Safe Medications in Pregnancy  ° °Acne: °Benzoyl Peroxide °Salicylic Acid ° °Backache/Headache: °Tylenol: 2 regular strength every 4 hours OR °             2 Extra strength every 6 hours ° °Colds/Coughs/Allergies: °Benadryl (alcohol free) 25 mg every 6 hours as needed °Breath right strips °Claritin °Cepacol throat lozenges °Chloraseptic throat spray °Cold-Eeze- up to three times per day °Cough drops, alcohol free °Flonase (by prescription   only) °Guaifenesin °Mucinex °Robitussin DM (plain only, alcohol free) °Saline nasal spray/drops °Sudafed (pseudoephedrine) & Actifed ** use only after [redacted] weeks gestation and if you do not have high blood pressure °Tylenol °Vicks Vaporub °Zinc lozenges °Zyrtec  ° °Constipation: °Colace °Ducolax suppositories °Fleet enema °Glycerin suppositories °Metamucil °Milk of magnesia °Miralax °Senokot °Smooth move tea ° °Diarrhea: °Kaopectate °Imodium A-D ° °*NO pepto Bismol ° °Hemorrhoids: °Anusol °Anusol HC °Preparation  H °Tucks ° °Indigestion: °Tums °Maalox °Mylanta °Zantac  °Pepcid ° °Insomnia: °Benadryl (alcohol free) 25mg every 6 hours as needed °Tylenol PM °Unisom, no Gelcaps ° °Leg Cramps: °Tums °MagGel ° °Nausea/Vomiting:  °Bonine °Dramamine °Emetrol °Ginger extract °Sea bands °Meclizine  °Nausea medication to take during pregnancy:  °Unisom (doxylamine succinate 25 mg tablets) Take one tablet daily at bedtime. If symptoms are not adequately controlled, the dose can be increased to a maximum recommended dose of two tablets daily (1/2 tablet in the morning, 1/2 tablet mid-afternoon and one at bedtime). °Vitamin B6 100mg tablets. Take one tablet twice a day (up to 200 mg per day). ° °Skin Rashes: °Aveeno products °Benadryl cream or 25mg every 6 hours as needed °Calamine Lotion °1% cortisone cream ° °Yeast infection: °Gyne-lotrimin 7 °Monistat 7 ° ° °**If taking multiple medications, please check labels to avoid duplicating the same active ingredients °**take medication as directed on the label °** Do not exceed 4000 mg of tylenol in 24 hours °**Do not take medications that contain aspirin or ibuprofen ° ° ° ° °

## 2018-02-12 NOTE — MAU Provider Note (Signed)
History     CSN: [id number]  Arrival date and time: 02/12/18 1107   First Provider Initiated Contact with Patient 02/12/18 1331      Chief Complaint  Patient presents with  . Hypertension  . Headache   HPI Charlene Schneider is a 22 y.o. G1P0 at 3663w4d who presents from the office for evaluation of elevated blood pressures and headache. She has a history of migraine headaches with multiple visits to MAU because of them. Previous labs normal in MAU. She denies any visual changes or epigastric pain. Denies pain, vaginal bleeding or leaking of fluid. Reports good fetal movement. She also has a history of anxiety and depression.  OB History    Gravida  1   Para      Term      Preterm      AB      Living        SAB      TAB      Ectopic      Multiple      Live Births              Past Medical History:  Diagnosis Date  . Anxiety   . Arachnoid cyst   . Chiari malformation type I (HCC)   . Migraines     Past Surgical History:  Procedure Laterality Date  . NO PAST SURGERIES      Family History  Problem Relation Age of Onset  . Cirrhosis Maternal Grandmother     Social History   Tobacco Use  . Smoking status: Current Every Day Smoker    Packs/day: 0.25    Types: Cigarettes  . Smokeless tobacco: Never Used  . Tobacco comment: 1 per day  Substance Use Topics  . Alcohol use: No    Frequency: Never  . Drug use: No    Allergies: No Known Allergies  Medications Prior to Admission  Medication Sig Dispense Refill Last Dose  . Magnesium 500 MG CAPS Take 1 capsule (500 mg total) by mouth daily. 30 capsule 0   . ondansetron (ZOFRAN ODT) 4 MG disintegrating tablet Take 1 tablet (4 mg total) by mouth every 8 (eight) hours as needed for nausea or vomiting. 15 tablet 0   . Prenatal Vit-Fe Fumarate-FA (PRENATAL MULTIVITAMIN) TABS tablet Take 1 tablet by mouth daily at 12 noon.   08/16/2017 at Unknown time  . promethazine (PHENERGAN) 25 MG tablet Take 1 tablet  (25 mg total) by mouth every 6 (six) hours as needed for nausea or vomiting. 30 tablet 0     Review of Systems  Constitutional: Negative.  Negative for fatigue and fever.  HENT: Negative.   Eyes: Negative for visual disturbance.  Respiratory: Negative.  Negative for shortness of breath.   Cardiovascular: Negative.  Negative for chest pain.  Gastrointestinal: Negative.  Negative for abdominal pain, constipation, diarrhea, nausea and vomiting.  Genitourinary: Negative.  Negative for dysuria.  Neurological: Positive for headaches. Negative for dizziness.   Physical Exam   Blood pressure (!) 142/77, pulse 92, temperature 98.9 F (37.2 C), temperature source Oral, resp. rate 17, height 5\' 8"  (1.727 m), weight 98.4 kg, last menstrual period 06/29/2017, SpO2 98 %.  Patient Vitals for the past 24 hrs:  BP Temp Temp src Pulse Resp SpO2 Height Weight  02/12/18 1400 130/84 - - 94 - - - -  02/12/18 1345 (!) 147/83 - - 99 - - - -  02/12/18 1330 (!) 142/77 - - 92 - - - -  02/12/18 1315 128/75 - - (!) 101 - - - -  02/12/18 1300 128/77 - - 99 - - - -  02/12/18 1245 (!) 153/84 - - (!) 110 - - - -  02/12/18 1230 (!) 154/94 - - (!) 109 - - - -  02/12/18 1229 - - - - - 98 % - -  02/12/18 1224 - - - - - 98 % - -  02/12/18 1219 - - - - - 98 % - -  02/12/18 1215 131/86 - - (!) 110 - - - -  02/12/18 1214 - - - - - 98 % - -  02/12/18 1200 (!) 143/93 - - (!) 109 - - - -  02/12/18 1149 - - - - - 98 % - -  02/12/18 1145 (!) 140/91 - - (!) 111 - - - -  02/12/18 1144 - - - - - 98 % - -  02/12/18 1139 - - - - - 98 % - -  02/12/18 1136 (!) 143/95 - - (!) 112 - 98 % - -  02/12/18 1118 (!) 158/99 98.9 F (37.2 C) Oral (!) 118 17 99 % 5\' 8"  (1.727 m) 98.4 kg    Physical Exam  Nursing note and vitals reviewed. Constitutional: She is oriented to person, place, and time. She appears well-developed and well-nourished. No distress.  HENT:  Head: Normocephalic.  Eyes: Pupils are equal, round, and reactive to  light.  Cardiovascular: Normal rate, regular rhythm and normal heart sounds.  Respiratory: Effort normal and breath sounds normal. No respiratory distress.  GI: Soft. Bowel sounds are normal. She exhibits no distension. There is no tenderness.  Neurological: She is alert and oriented to person, place, and time.  Skin: Skin is warm and dry.  Psychiatric: She has a normal mood and affect. Her behavior is normal. Judgment and thought content normal.   Fetal Tracing:  Baseline: 135 Variability: moderate Accels: 15x15 Decels: none  Toco: none  MAU Course  Procedures Results for orders placed or performed during the hospital encounter of 02/12/18 (from the past 24 hour(s))  Protein / creatinine ratio, urine     Status: None   Collection Time: 02/12/18 11:54 AM  Result Value Ref Range   Creatinine, Urine 98.00 mg/dL   Total Protein, Urine 13 mg/dL   Protein Creatinine Ratio 0.13 0.00 - 0.15 mg/mg[Cre]  CBC     Status: Abnormal   Collection Time: 02/12/18 12:17 PM  Result Value Ref Range   WBC 13.1 (H) 4.0 - 10.5 K/uL   RBC 4.07 3.87 - 5.11 MIL/uL   Hemoglobin 11.8 (L) 12.0 - 15.0 g/dL   HCT 16.135.2 (L) 09.636.0 - 04.546.0 %   MCV 86.5 78.0 - 100.0 fL   MCH 29.0 26.0 - 34.0 pg   MCHC 33.5 30.0 - 36.0 g/dL   RDW 40.913.0 81.111.5 - 91.415.5 %   Platelets 322 150 - 400 K/uL  Comprehensive metabolic panel     Status: Abnormal   Collection Time: 02/12/18 12:17 PM  Result Value Ref Range   Sodium 135 135 - 145 mmol/L   Potassium 3.7 3.5 - 5.1 mmol/L   Chloride 106 98 - 111 mmol/L   CO2 21 (L) 22 - 32 mmol/L   Glucose, Bld 111 (H) 70 - 99 mg/dL   BUN 7 6 - 20 mg/dL   Creatinine, Ser 7.820.47 0.44 - 1.00 mg/dL   Calcium 8.9 8.9 - 95.610.3 mg/dL   Total Protein 6.9 6.5 - 8.1 g/dL  Albumin 3.0 (L) 3.5 - 5.0 g/dL   AST 16 15 - 41 U/L   ALT 22 0 - 44 U/L   Alkaline Phosphatase 120 38 - 126 U/L   Total Bilirubin 0.3 0.3 - 1.2 mg/dL   GFR calc non Af Amer >60 >60 mL/min   GFR calc Af Amer >60 >60 mL/min    Anion gap 8 5 - 15  Uric acid     Status: None   Collection Time: 02/12/18 12:17 PM  Result Value Ref Range   Uric Acid, Serum 3.0 2.5 - 7.1 mg/dL   MDM Prenatal records from private office reviewed  Orders entered by Dr. Juliene Pina: CBC, CMP, Protein/creat ratio IV with NS Migraine cockail Diludid 1mg  IV  Consulted with Dr. Ernestina Penna regarding results and headache- no evidence of preeclampsia at this time. Will treat headache as previously treated.   Patient was given magnesium sulfate for headache at last MAU visit. Patient stating the magnesium didn't help and insisting that only percocet helps the headache after dilaudid and migraine cocktail. Will try 2 percocet while in MAU.  Patient now rating HA a 2/10. Consulted with Dr. Ernestina Penna- ok to discharge patient home. Will start labetalol 100mg  TID and follow up in the office next week.  Assessment and Plan   1. Other migraine without status migrainosus, not intractable   2. Transient hypertension of pregnancy in third trimester   3. [redacted] weeks gestation of pregnancy    -Discharge home in stable condition -Preeclampsia precautions discussed -Patient advised to follow-up with [facility] as scheduled for prenatal care -Patient may return to MAU as needed or if her condition were to change or worsen  Rolm Bookbinder CNM 02/12/2018, 1:54 PM

## 2018-02-12 NOTE — MAU Provider Note (Signed)
  History     CSN: [id number]  Arrival date and time: 02/12/18 1107  Pt is a 22 yo female at 32.[redacted] wks gestation, with known migraine hx, anxiety, depression. Sent to MAU after seen in office for HA/ migraine and BP was noted to be elevated.  Prior PEC labs normal in MAU.    Chief Complaint  Patient presents with  . Hypertension  . Headache   HPI   Past Medical History:  Diagnosis Date  . Anxiety   . Arachnoid cyst   . Chiari malformation type I (HCC)   . Migraines     Past Surgical History:  Procedure Laterality Date  . NO PAST SURGERIES      Family History  Problem Relation Age of Onset  . Cirrhosis Maternal Grandmother     Social History   Tobacco Use  . Smoking status: Current Every Day Smoker    Packs/day: 0.25    Types: Cigarettes  . Smokeless tobacco: Never Used  . Tobacco comment: 1 per day  Substance Use Topics  . Alcohol use: No    Frequency: Never  . Drug use: No    Allergies: No Known Allergies  Medications Prior to Admission  Medication Sig Dispense Refill Last Dose  . Magnesium 500 MG CAPS Take 1 capsule (500 mg total) by mouth daily. 30 capsule 0   . ondansetron (ZOFRAN ODT) 4 MG disintegrating tablet Take 1 tablet (4 mg total) by mouth every 8 (eight) hours as needed for nausea or vomiting. 15 tablet 0   . Prenatal Vit-Fe Fumarate-FA (PRENATAL MULTIVITAMIN) TABS tablet Take 1 tablet by mouth daily at 12 noon.   08/16/2017 at Unknown time  . promethazine (PHENERGAN) 25 MG tablet Take 1 tablet (25 mg total) by mouth every 6 (six) hours as needed for nausea or vomiting. 30 tablet 0     Review of Systems Physical Exam   Blood pressure (!) 140/91, pulse (!) 111, temperature 98.9 F (37.2 C), temperature source Oral, resp. rate 17, height 5\' 8"  (1.727 m), weight 98.4 kg, last menstrual period 06/29/2017, SpO2 98 %.  Physical Exam  MAU Course  Procedures PIH labs, serial BPs Peripheral IV and Migraine cocktail with Reglan/ Benadryl/  Dexamethasone  Assessment and Plan  R/o PEC Migraine management. Dr Ernestina PennaFogleman on call at 1 pm, knows pt well, will plan further care   Charlene Schneider 02/12/2018, 12:01 PM

## 2018-02-12 NOTE — MAU Note (Signed)
Pt went to office today for complaint of "bad migraine headache", b/p elevated at office visit and they sent her here for further eval

## 2018-02-25 ENCOUNTER — Encounter: Payer: Self-pay | Admitting: Diagnostic Neuroimaging

## 2018-02-25 ENCOUNTER — Ambulatory Visit: Payer: BLUE CROSS/BLUE SHIELD | Admitting: Diagnostic Neuroimaging

## 2018-02-25 VITALS — BP 133/89 | HR 110 | Ht 68.0 in | Wt 215.6 lb

## 2018-02-25 DIAGNOSIS — G43109 Migraine with aura, not intractable, without status migrainosus: Secondary | ICD-10-CM

## 2018-02-25 DIAGNOSIS — Z3A34 34 weeks gestation of pregnancy: Secondary | ICD-10-CM | POA: Diagnosis not present

## 2018-02-25 NOTE — Patient Instructions (Signed)
-   continue labetalol (for hypertension and migraine prevention) - tylenol as needed for breakthrough HA - phenergan as needed for nausea

## 2018-02-25 NOTE — Progress Notes (Signed)
[facility]  PATIENT: Charlene Schneider DOB: [birth year]  REFERRING CLINICIAN: Fogelman HISTORY FROM: patient  REASON FOR VISIT: new consult    HISTORICAL  CHIEF COMPLAINT:  Chief Complaint  Patient presents with  . New Patient (Initial Visit)    Rm 7, father of baby girl.  34wks.    . Migraines    Dr. Algie Coffer.  worsening migraines since pregnancy. No migraine today. Has hx of migraines since age 22.    HISTORY OF PRESENT ILLNESS:   22 year old female here for evaluation of headaches.  Patient is currently [redacted] weeks pregnant, with estimated due date April 05, 2018.  Patient has history of headaches since age 66 years old with sharp pounding sensation and nausea, vomiting, seeing spots of light.  Headaches can last hours or days at a time.  Since pregnancy headaches have continued.  They feel like her typical migraine headaches.  She is averaging 3-4 headaches per week.  Patient was recently diagnosed with hypertension, now started on labetalol.  Preeclampsia was ruled out.  Patient also has been trying Tylenol, Fioricet and Phenergan as needed.  Patient has had concussion x4 in the past, with last concussion in 2017.  Patient states that her concussion symptoms in the past have fully resolved.   REVIEW OF SYSTEMS: Full 14 system review of systems performed and negative with exception of: Depression anxiety racing thoughts headache numbness memory loss dizziness insomnia blurred vision fatigue.  ALLERGIES: No Known Allergies  HOME MEDICATIONS: Outpatient Medications Prior to Visit  Medication Sig Dispense Refill  . buPROPion (WELLBUTRIN SR) 200 MG 12 hr tablet Take 200 mg by mouth 2 (two) times daily.  2  . busPIRone (BUSPAR) 15 MG tablet Take 15 mg by mouth 2 (two) times daily.    Marland Kitchen labetalol (NORMODYNE) 100 MG tablet Take 1 tablet (100 mg total) by mouth 3 (three) times daily. 90 tablet 0  . Magnesium 500 MG CAPS Take 1 capsule (500 mg total) by mouth  daily. 30 capsule 0  . ondansetron (ZOFRAN ODT) 4 MG disintegrating tablet Take 1 tablet (4 mg total) by mouth every 8 (eight) hours as needed for nausea or vomiting. 15 tablet 0  . Prenatal Vit-Fe Fumarate-FA (PRENATAL MULTIVITAMIN) TABS tablet Take 1 tablet by mouth daily at 12 noon.    . promethazine (PHENERGAN) 25 MG tablet Take 1 tablet (25 mg total) by mouth every 6 (six) hours as needed for nausea or vomiting. 30 tablet 0  . buPROPion (ZYBAN) 150 MG 12 hr tablet Take 150 mg by mouth 2 (two) times daily.     No facility-administered medications prior to visit.     PAST MEDICAL HISTORY: Past Medical History:  Diagnosis Date  . Anxiety   . Arachnoid cyst   . Chiari malformation type I (HCC)   . Hypertension   . Migraines     PAST SURGICAL HISTORY: Past Surgical History:  Procedure Laterality Date  . NO PAST SURGERIES      FAMILY HISTORY: Family History  Problem Relation Age of Onset  . Cirrhosis Maternal Grandmother   . Depression Mother     SOCIAL HISTORY: Social History   Socioeconomic History  . Marital status: Single    Spouse name: Not on file  . Number of children: Not on file  . Years of education: Not on file  . Highest education level: Not on file  Occupational History  . Not on file  Social Needs  . Financial resource strain: Not on  file  . Food insecurity:    Worry: Not on file    Inability: Not on file  . Transportation needs:    Medical: Not on file    Non-medical: Not on file  Tobacco Use  . Smoking status: Current Every Day Smoker    Packs/day: 0.10    Types: Cigarettes  . Smokeless tobacco: Never Used  . Tobacco comment: 1 per day  Substance and Sexual Activity  . Alcohol use: No    Frequency: Never  . Drug use: No  . Sexual activity: Yes  Lifestyle  . Physical activity:    Days per week: Not on file    Minutes per session: Not on file  . Stress: Not on file  Relationships  . Social connections:    Talks on phone: Not on file     Gets together: Not on file    Attends religious service: Not on file    Active member of club or organization: Not on file    Attends meetings of clubs or organizations: Not on file    Relationship status: Not on file  . Intimate partner violence:    Fear of current or ex partner: Not on file    Emotionally abused: Not on file    Physically abused: Not on file    Forced sexual activity: Not on file  Other Topics Concern  . Not on file  Social History Narrative   Lives home with parents.  Works at [employer].  Education:   Some college.  Some caffeine.      PHYSICAL EXAM  GENERAL EXAM/CONSTITUTIONAL: Vitals:  Vitals:   02/25/18 1517  BP: 133/89  Pulse: (!) 110  Weight: 215 lb 9.6 oz (97.8 kg)  Height: 5\' 8"  (1.727 m)     Body mass index is 32.78 kg/m. Wt Readings from Last 3 Encounters:  02/25/18 215 lb 9.6 oz (97.8 kg)  02/12/18 217 lb (98.4 kg)  12/29/17 208 lb (94.3 kg)     Patient is in no distress; well developed, nourished and groomed; neck is supple  GRAVID ABDOMEN  CARDIOVASCULAR:  Examination of carotid arteries is normal; no carotid bruits  Regular rate and rhythm, no murmurs  Examination of peripheral vascular system by observation and palpation is normal  EYES:  Ophthalmoscopic exam of optic discs and posterior segments is normal; no papilledema or hemorrhages  Visual Acuity Screening   Right eye Left eye Both eyes  Without correction:     With correction: 20/20 20/20      MUSCULOSKELETAL:  Gait, strength, tone, movements noted in Neurologic exam below  NEUROLOGIC: MENTAL STATUS:  No flowsheet data found.  awake, alert, oriented to person, place and time  recent and remote memory intact  normal attention and concentration  language fluent, comprehension intact, naming intact  fund of knowledge appropriate  CRANIAL NERVE:   2nd - no papilledema on fundoscopic exam  2nd, 3rd, 4th, 6th - pupils equal and reactive to  light, visual fields full to confrontation, extraocular muscles intact, no nystagmus  5th - facial sensation symmetric  7th - facial strength symmetric  8th - hearing intact  9th - palate elevates symmetrically, uvula midline  11th - shoulder shrug symmetric  12th - tongue protrusion midline  MOTOR:   normal bulk and tone, full strength in the BUE, BLE  SENSORY:   normal and symmetric to light touch, temperature, vibration  COORDINATION:   finger-nose-finger, fine finger movements normal  REFLEXES:  deep tendon reflexes present and symmetric  GAIT/STATION:   narrow based gait     DIAGNOSTIC DATA (LABS, IMAGING, TESTING) - I reviewed patient records, labs, notes, testing and imaging myself where available.  Lab Results  Component Value Date   WBC 13.1 (H) 02/12/2018   HGB 11.8 (L) 02/12/2018   HCT 35.2 (L) 02/12/2018   MCV 86.5 02/12/2018   PLT 322 02/12/2018      Component Value Date/Time   NA 135 02/12/2018 1217   K 3.7 02/12/2018 1217   CL 106 02/12/2018 1217   CO2 21 (L) 02/12/2018 1217   GLUCOSE 111 (H) 02/12/2018 1217   BUN 7 02/12/2018 1217   CREATININE 0.47 02/12/2018 1217   CALCIUM 8.9 02/12/2018 1217   PROT 6.9 02/12/2018 1217   ALBUMIN 3.0 (L) 02/12/2018 1217   AST 16 02/12/2018 1217   ALT 22 02/12/2018 1217   ALKPHOS 120 02/12/2018 1217   BILITOT 0.3 02/12/2018 1217   GFRNONAA >60 02/12/2018 1217   GFRAA >60 02/12/2018 1217   No results found for: CHOL, HDL, LDLCALC, LDLDIRECT, TRIG, CHOLHDL No results found for: RUEA5W No results found for: VITAMINB12 No results found for: TSH   07/05/16 MRI brain (without) [report only] - normal      ASSESSMENT AND PLAN  22 y.o. year old female here with history of migraine headaches with aura since age 85 years old, with continued migraine headaches during pregnancy.  Neurologic examination is unremarkable.  Headaches have similar features to her prior migraine headaches.   Dx: migraine  (in pregnancy)  1. Migraine with aura and without status migrainosus, not intractable   2. [redacted] weeks gestation of pregnancy      PLAN:  - continue labetalol (for hypertension and migraine prevention) - tylenol as needed for breakthrough HA - phenergan as needed for nausea  Return in about 3 months (around 05/27/2018).    Suanne Marker, MD 02/25/2018, 3:50 PM Certified in Neurology, Neurophysiology and Neuroimaging  [facility] [address] [place], [place] [address] [phone]

## 2018-03-03 ENCOUNTER — Encounter (HOSPITAL_COMMUNITY): Payer: Self-pay | Admitting: *Deleted

## 2018-03-03 ENCOUNTER — Inpatient Hospital Stay (HOSPITAL_COMMUNITY)
Admission: AD | Admit: 2018-03-03 | Discharge: 2018-03-03 | Disposition: A | Discharge status: Home / Self Care | Payer: BLUE CROSS/BLUE SHIELD | Source: Ambulatory Visit | Attending: Obstetrics | Admitting: Obstetrics

## 2018-03-03 DIAGNOSIS — Z3A35 35 weeks gestation of pregnancy: Secondary | ICD-10-CM | POA: Insufficient documentation

## 2018-03-03 DIAGNOSIS — O163 Unspecified maternal hypertension, third trimester: Secondary | ICD-10-CM | POA: Diagnosis not present

## 2018-03-03 DIAGNOSIS — R109 Unspecified abdominal pain: Secondary | ICD-10-CM | POA: Diagnosis not present

## 2018-03-03 DIAGNOSIS — O99333 Smoking (tobacco) complicating pregnancy, third trimester: Secondary | ICD-10-CM | POA: Insufficient documentation

## 2018-03-03 DIAGNOSIS — O26893 Other specified pregnancy related conditions, third trimester: Secondary | ICD-10-CM

## 2018-03-03 DIAGNOSIS — F419 Anxiety disorder, unspecified: Secondary | ICD-10-CM | POA: Insufficient documentation

## 2018-03-03 DIAGNOSIS — O99343 Other mental disorders complicating pregnancy, third trimester: Secondary | ICD-10-CM | POA: Diagnosis not present

## 2018-03-03 DIAGNOSIS — O368130 Decreased fetal movements, third trimester, not applicable or unspecified: Secondary | ICD-10-CM | POA: Diagnosis present

## 2018-03-03 DIAGNOSIS — F17210 Nicotine dependence, cigarettes, uncomplicated: Secondary | ICD-10-CM | POA: Diagnosis not present

## 2018-03-03 DIAGNOSIS — G935 Compression of brain: Secondary | ICD-10-CM | POA: Insufficient documentation

## 2018-03-03 DIAGNOSIS — Z79899 Other long term (current) drug therapy: Secondary | ICD-10-CM | POA: Insufficient documentation

## 2018-03-03 LAB — URINALYSIS, ROUTINE W REFLEX MICROSCOPIC
Bacteria, UA: NONE SEEN
Bilirubin Urine: NEGATIVE
GLUCOSE, UA: NEGATIVE mg/dL
Ketones, ur: NEGATIVE mg/dL
Leukocytes, UA: NEGATIVE
NITRITE: NEGATIVE
Protein, ur: NEGATIVE mg/dL
SPECIFIC GRAVITY, URINE: 1.014 (ref 1.005–1.030)
pH: 6 (ref 5.0–8.0)

## 2018-03-03 LAB — COMPREHENSIVE METABOLIC PANEL
ALT: 15 U/L (ref 0–44)
AST: 14 U/L — AB (ref 15–41)
Albumin: 3 g/dL — ABNORMAL LOW (ref 3.5–5.0)
Alkaline Phosphatase: 139 U/L — ABNORMAL HIGH (ref 38–126)
Anion gap: 10 (ref 5–15)
BUN: 6 mg/dL (ref 6–20)
CHLORIDE: 104 mmol/L (ref 98–111)
CO2: 22 mmol/L (ref 22–32)
Calcium: 9.1 mg/dL (ref 8.9–10.3)
Creatinine, Ser: 0.38 mg/dL — ABNORMAL LOW (ref 0.44–1.00)
Glucose, Bld: 122 mg/dL — ABNORMAL HIGH (ref 70–99)
Potassium: 3.8 mmol/L (ref 3.5–5.1)
Sodium: 136 mmol/L (ref 135–145)
Total Bilirubin: 0.1 mg/dL — ABNORMAL LOW (ref 0.3–1.2)
Total Protein: 6.6 g/dL (ref 6.5–8.1)

## 2018-03-03 LAB — PROTEIN / CREATININE RATIO, URINE
CREATININE, URINE: 69 mg/dL
Protein Creatinine Ratio: 0.12 mg/mg{Cre} (ref 0.00–0.15)
TOTAL PROTEIN, URINE: 8 mg/dL

## 2018-03-03 LAB — CBC WITH DIFFERENTIAL/PLATELET
BASOS ABS: 0 10*3/uL (ref 0.0–0.1)
Basophils Relative: 0 %
Eosinophils Absolute: 0.2 10*3/uL (ref 0.0–0.7)
Eosinophils Relative: 1 %
HCT: 33.9 % — ABNORMAL LOW (ref 36.0–46.0)
Hemoglobin: 11 g/dL — ABNORMAL LOW (ref 12.0–15.0)
LYMPHS ABS: 2.2 10*3/uL (ref 0.7–4.0)
LYMPHS PCT: 14 %
MCH: 28 pg (ref 26.0–34.0)
MCHC: 32.4 g/dL (ref 30.0–36.0)
MCV: 86.3 fL (ref 78.0–100.0)
MONOS PCT: 3 %
Monocytes Absolute: 0.4 10*3/uL (ref 0.1–1.0)
NEUTROS ABS: 13.4 10*3/uL — AB (ref 1.7–7.7)
NEUTROS PCT: 82 %
PLATELETS: 335 10*3/uL (ref 150–400)
RBC: 3.93 MIL/uL (ref 3.87–5.11)
RDW: 13.4 % (ref 11.5–15.5)
WBC: 16.3 10*3/uL — ABNORMAL HIGH (ref 4.0–10.5)

## 2018-03-03 MED ORDER — ALPRAZOLAM 0.5 MG PO TABS
0.5000 mg | ORAL_TABLET | Freq: Once | ORAL | Status: AC
  Administered 2018-03-03: 0.5 mg via ORAL

## 2018-03-03 NOTE — MAU Note (Signed)
Pt reports she felt the baby move this am, but the movement has been decreased for the last 3-4 days. Denies bleeding or ROM. Denies pain

## 2018-03-03 NOTE — Discharge Instructions (Signed)

## 2018-03-03 NOTE — MAU Note (Signed)
Urine in lab 

## 2018-03-03 NOTE — MAU Note (Signed)
Pt took 1 gm tylenol at 0800 today and 325 of ASA at 1600 for headache.

## 2018-03-03 NOTE — MAU Provider Note (Signed)
History     CSN: [id number]  Arrival date and time: 03/03/18 1811   None     Chief Complaint  Patient presents with  . Decreased Fetal Movement   HPI Charlene Schneider is 22 y.o. G1P0 [redacted]w[redacted]d weeks presenting with report of having a sharp abdominal pain 3 days ago that lasted 1 day.  Points to mid-upper right quadrant. Decreased fetal movement since pain 3 days ago.  Reports a little more movement since being in MAU.  Neg for N&V, diarrhea or constipation.  Neg for vaginal bleeding, abdominal pain and loss of fluid. Neg for abnormal vaginal discharge or odor. She is a patient of Dr.Fogleman.  Hx of chronic hypertension treated with Labetalol.      Past Medical History:  Diagnosis Date  . Anxiety   . Arachnoid cyst   . Chiari malformation type I (HCC)   . Hypertension   . Migraines     Past Surgical History:  Procedure Laterality Date  . NO PAST SURGERIES      Family History  Problem Relation Age of Onset  . Cirrhosis Maternal Grandmother   . Depression Mother     Social History   Tobacco Use  . Smoking status: Current Every Day Smoker    Packs/day: 0.10    Types: Cigarettes  . Smokeless tobacco: Never Used  . Tobacco comment: 1 per day  Substance Use Topics  . Alcohol use: No    Frequency: Never  . Drug use: No    Allergies: No Known Allergies  Medications Prior to Admission  Medication Sig Dispense Refill Last Dose  . buPROPion (WELLBUTRIN SR) 200 MG 12 hr tablet Take 200 mg by mouth 2 (two) times daily.  2 Taking  . busPIRone (BUSPAR) 15 MG tablet Take 15 mg by mouth 2 (two) times daily.   Taking  . labetalol (NORMODYNE) 100 MG tablet Take 1 tablet (100 mg total) by mouth 3 (three) times daily. 90 tablet 0 Taking  . Magnesium 500 MG CAPS Take 1 capsule (500 mg total) by mouth daily. 30 capsule 0 Taking  . ondansetron (ZOFRAN ODT) 4 MG disintegrating tablet Take 1 tablet (4 mg total) by mouth every 8 (eight) hours as needed for nausea or vomiting. 15 tablet 0  Taking  . Prenatal Vit-Fe Fumarate-FA (PRENATAL MULTIVITAMIN) TABS tablet Take 1 tablet by mouth daily at 12 noon.   Taking  . promethazine (PHENERGAN) 25 MG tablet Take 1 tablet (25 mg total) by mouth every 6 (six) hours as needed for nausea or vomiting. 30 tablet 0 Taking    Review of Systems  Constitutional: Negative for fever.  Gastrointestinal: Positive for abdominal pain (3 days ago- mid-upper right sided pain. ).       Decreased fetal movement X 3 days.   Genitourinary: Negative for dyspareunia, pelvic pain, vaginal bleeding and vaginal discharge.  Neurological: Negative for dizziness.   Physical Exam   Blood pressure 137/87, pulse (!) 108, temperature 98.5 F (36.9 C), temperature source Oral, resp. rate 16, height 5\' 8"  (1.727 m), weight 100.2 kg, last menstrual period 06/29/2017, SpO2 100 %.  Physical Exam  Constitutional: She is oriented to person, place, and time. She appears well-developed and well-nourished. No distress.  HENT:  Head: Normocephalic.  Neck: Normal range of motion.  Cardiovascular: Normal rate.  Respiratory: Effort normal.  GI: Soft. She exhibits no distension and no mass. There is no tenderness. There is no rebound and no guarding.  Neurological: She is alert and oriented  to person, place, and time.  Skin: Skin is warm and dry.  Psychiatric: She has a normal mood and affect. Her behavior is normal. Thought content normal.   Vitals:   03/03/18 1845 03/03/18 1900 03/03/18 1930 03/03/18 2059  BP: 129/77 (!) 146/85 130/72 137/87  Pulse: (!) 119 (!) 112 (!) 118 (!) 108  Resp:    16  Temp:    98.5 F (36.9 C)  TempSrc:    Oral  SpO2:      Weight:      Height:       Results for orders placed or performed during the hospital encounter of 03/03/18 (from the past 24 hour(s))  Protein / creatinine ratio, urine     Status: None   Collection Time: 03/03/18  6:34 PM  Result Value Ref Range   Creatinine, Urine 69.00 mg/dL   Total Protein, Urine 8 mg/dL    Protein Creatinine Ratio 0.12 0.00 - 0.15 mg/mg[Cre]  Urinalysis, Routine w reflex microscopic     Status: Abnormal   Collection Time: 03/03/18  6:34 PM  Result Value Ref Range   Color, Urine YELLOW YELLOW   APPearance HAZY (A) CLEAR   Specific Gravity, Urine 1.014 1.005 - 1.030   pH 6.0 5.0 - 8.0   Glucose, UA NEGATIVE NEGATIVE mg/dL   Hgb urine dipstick MODERATE (A) NEGATIVE   Bilirubin Urine NEGATIVE NEGATIVE   Ketones, ur NEGATIVE NEGATIVE mg/dL   Protein, ur NEGATIVE NEGATIVE mg/dL   Nitrite NEGATIVE NEGATIVE   Leukocytes, UA NEGATIVE NEGATIVE   RBC / HPF 0-5 0 - 5 RBC/hpf   WBC, UA 0-5 0 - 5 WBC/hpf   Bacteria, UA NONE SEEN NONE SEEN   Squamous Epithelial / LPF 11-20 0 - 5   Mucus PRESENT   CBC with Differential/Platelet     Status: Abnormal   Collection Time: 03/03/18  7:39 PM  Result Value Ref Range   WBC 16.3 (H) 4.0 - 10.5 K/uL   RBC 3.93 3.87 - 5.11 MIL/uL   Hemoglobin 11.0 (L) 12.0 - 15.0 g/dL   HCT 97.7 (L) 41.4 - 23.9 %   MCV 86.3 78.0 - 100.0 fL   MCH 28.0 26.0 - 34.0 pg   MCHC 32.4 30.0 - 36.0 g/dL   RDW 53.2 02.3 - 34.3 %   Platelets 335 150 - 400 K/uL   Neutrophils Relative % 82 %   Neutro Abs 13.4 (H) 1.7 - 7.7 K/uL   Lymphocytes Relative 14 %   Lymphs Abs 2.2 0.7 - 4.0 K/uL   Monocytes Relative 3 %   Monocytes Absolute 0.4 0.1 - 1.0 K/uL   Eosinophils Relative 1 %   Eosinophils Absolute 0.2 0.0 - 0.7 K/uL   Basophils Relative 0 %   Basophils Absolute 0.0 0.0 - 0.1 K/uL  Comprehensive metabolic panel     Status: Abnormal   Collection Time: 03/03/18  7:39 PM  Result Value Ref Range   Sodium 136 135 - 145 mmol/L   Potassium 3.8 3.5 - 5.1 mmol/L   Chloride 104 98 - 111 mmol/L   CO2 22 22 - 32 mmol/L   Glucose, Bld 122 (H) 70 - 99 mg/dL   BUN 6 6 - 20 mg/dL   Creatinine, Ser 5.68 (L) 0.44 - 1.00 mg/dL   Calcium 9.1 8.9 - 61.6 mg/dL   Total Protein 6.6 6.5 - 8.1 g/dL   Albumin 3.0 (L) 3.5 - 5.0 g/dL   AST 14 (L) 15 - 41 U/L  ALT 15 0 - 44 U/L    Alkaline Phosphatase 139 (H) 38 - 126 U/L   Total Bilirubin 0.1 (L) 0.3 - 1.2 mg/dL   GFR calc non Af Amer >60 >60 mL/min   GFR calc Af Amer >60 >60 mL/min   Anion gap 10 5 - 15   MAU Course  Procedures  Patient is asymptomatic --+ blood in urine, o/w neg.  Urine culture sent to lab  MDM MSE Exam Labs NST-baseline 150         Variability  Mod         Accels 15X15         Contractions none seen Called Dr. Ernestina Penna to report MSE, labs, NST.  Order given for [facility] labs, UA and Xanex 0.5mg  po X 1 (dx of anxiety per Dr. Ernestina Penna).  If Labs are neg, may d/c to home.    Discussed plan of care with Jasara.  She agreed with plan of care, including medication.  Her Mom is here to drive her home. Assessment and Plan  A:  Decreased fetal movement at [redacted]w[redacted]d       Chronic hypertension-treated       PIH ruled out   P: Instructed to stay well hydrated     Keep scheduled appointment with Dr. Jeri Modena M Key 03/03/2018, 9:00 PM

## 2018-03-05 LAB — URINE CULTURE

## 2018-03-08 ENCOUNTER — Other Ambulatory Visit: Payer: Self-pay

## 2018-03-10 ENCOUNTER — Other Ambulatory Visit: Payer: Self-pay | Admitting: Obstetrics

## 2018-03-11 ENCOUNTER — Telehealth (HOSPITAL_COMMUNITY): Payer: Self-pay | Admitting: *Deleted

## 2018-03-11 ENCOUNTER — Encounter (HOSPITAL_COMMUNITY): Payer: Self-pay | Admitting: *Deleted

## 2018-03-11 NOTE — Telephone Encounter (Signed)
Preadmission screen  

## 2018-03-13 ENCOUNTER — Other Ambulatory Visit: Payer: Self-pay | Admitting: Obstetrics

## 2018-03-14 ENCOUNTER — Other Ambulatory Visit: Payer: Self-pay | Admitting: Obstetrics

## 2018-03-14 NOTE — H&P (Signed)
Charlene KaufmannMelissa Schneider is a 22 y.o. G1P0 at 5359w1d presenting for IOL due to gest htn. Pt notes intermittent contractions. Good fetal movement, No vaginal bleeding, not leaking fluid. Continues with chronic HA and intermittent elevated bps.   [facility] - Unplanned but desired preg. FOB involved initially but unclear if he will be involved. Pt does not appear to have much social support- SW consult - Anxiety. Pt with significant anxiety, has declined therapist due to time constraints. Poorly congtrolled despite Buspar 15 bid, Wellbutrin 200mg  and Lexapro 10mg . Pt continued to need Xanax about weekly for panic attacks. Suspect anxiety has been affecting her bps - Chronic HA, follow with neuro. Used Fiorcet several times per wk - Smoker, decreased from 1ppd to 3 cigs/ day, needing them more when stressed - GBS positive - htn, started at 32 wks, pt has had many eval in office and MAu, labs always neg for PEC. Variable bps, likely anxiety component but enough elevations (no severe range) to start labetalol 200 tid at 33 wks.  - Fetal growth- AGA at 35 wks.    Prenatal Transfer Tool  Maternal Diabetes: No Genetic Screening: Normal Maternal Ultrasounds/Referrals: Normal Fetal Ultrasounds or other Referrals:  None Maternal Substance Abuse:  No Significant Maternal Medications:  None Significant Maternal Lab Results: None     OB History    Gravida  1   Para      Term      Preterm      AB      Living        SAB      TAB      Ectopic      Multiple      Live Births             Past Medical History:  Diagnosis Date  . Anxiety   . Arachnoid cyst   . Chiari malformation type I (HCC)   . Hypertension   . Migraines   . Transient hypertension of pregnancy, third trimester    Past Surgical History:  Procedure Laterality Date  . NO PAST SURGERIES     Family History: family history includes Cirrhosis in her maternal grandmother; Depression in her  mother; Thyroid disease in her maternal grandmother. Social History:  reports that she has been smoking cigarettes. She has been smoking about 0.10 packs per day. She has never used smokeless tobacco. She reports that she does not drink alcohol or use drugs.  Review of Systems - Negative except HA, anxiety    Physical Exam: will update on rounds  Prenatal labs: ABO, Rh: A/Positive/-- (03/18 0000) Antibody: Negative (03/18 0000) Rubella: Immune (03/18 0000) RPR: Nonreactive (03/18 0000)  HBsAg: Negative (03/18 0000)  HIV: Non-reactive (03/18 0000)  GBS: Positive (04/08 0000)  1 hr Glucola 85  Genetic screening nl quad Anatomy US normal   Assessment/Plan: 22 y.o. G1P0 at 6559w1d - gest htn. R/o PEC, move to delivery - GBS pos, plan PCN - IOL, plan cytotec x 1 then foley/ pit - Anxiety, poorly controlled, young single mom with low support- SW consult   Lendon ColonelKelly A Trevia Nop 03/14/2018, 11:13 PM

## 2018-03-16 ENCOUNTER — Inpatient Hospital Stay (HOSPITAL_COMMUNITY)
Admission: RE | Admit: 2018-03-16 | Discharge: 2018-03-16 | Disposition: A | Discharge status: Home / Self Care | Payer: BLUE CROSS/BLUE SHIELD | Source: Ambulatory Visit | Attending: Obstetrics | Admitting: Obstetrics

## 2018-03-16 ENCOUNTER — Other Ambulatory Visit: Payer: Self-pay

## 2018-03-16 ENCOUNTER — Encounter (HOSPITAL_COMMUNITY): Payer: Self-pay | Admitting: *Deleted

## 2018-03-16 ENCOUNTER — Inpatient Hospital Stay (HOSPITAL_COMMUNITY)
Admission: AD | Admit: 2018-03-16 | Discharge: 2018-03-20 | DRG: 787 | Disposition: A | Discharge status: Home / Self Care | Payer: BLUE CROSS/BLUE SHIELD | Attending: Obstetrics | Admitting: Obstetrics

## 2018-03-16 DIAGNOSIS — O134 Gestational [pregnancy-induced] hypertension without significant proteinuria, complicating childbirth: Principal | ICD-10-CM | POA: Diagnosis present

## 2018-03-16 DIAGNOSIS — F17210 Nicotine dependence, cigarettes, uncomplicated: Secondary | ICD-10-CM | POA: Diagnosis present

## 2018-03-16 DIAGNOSIS — O99334 Smoking (tobacco) complicating childbirth: Secondary | ICD-10-CM | POA: Diagnosis present

## 2018-03-16 DIAGNOSIS — Z3A37 37 weeks gestation of pregnancy: Secondary | ICD-10-CM

## 2018-03-16 DIAGNOSIS — O9081 Anemia of the puerperium: Secondary | ICD-10-CM | POA: Diagnosis not present

## 2018-03-16 DIAGNOSIS — O99824 Streptococcus B carrier state complicating childbirth: Secondary | ICD-10-CM | POA: Diagnosis present

## 2018-03-16 DIAGNOSIS — F418 Other specified anxiety disorders: Secondary | ICD-10-CM | POA: Diagnosis present

## 2018-03-16 DIAGNOSIS — D62 Acute posthemorrhagic anemia: Secondary | ICD-10-CM | POA: Diagnosis not present

## 2018-03-16 DIAGNOSIS — O99344 Other mental disorders complicating childbirth: Secondary | ICD-10-CM | POA: Diagnosis present

## 2018-03-16 DIAGNOSIS — O139 Gestational [pregnancy-induced] hypertension without significant proteinuria, unspecified trimester: Secondary | ICD-10-CM | POA: Diagnosis present

## 2018-03-16 DIAGNOSIS — F329 Major depressive disorder, single episode, unspecified: Secondary | ICD-10-CM | POA: Diagnosis present

## 2018-03-16 DIAGNOSIS — Z98891 History of uterine scar from previous surgery: Secondary | ICD-10-CM

## 2018-03-16 DIAGNOSIS — Z3490 Encounter for supervision of normal pregnancy, unspecified, unspecified trimester: Secondary | ICD-10-CM | POA: Diagnosis present

## 2018-03-16 DIAGNOSIS — F419 Anxiety disorder, unspecified: Secondary | ICD-10-CM | POA: Diagnosis present

## 2018-03-16 DIAGNOSIS — O9902 Anemia complicating childbirth: Secondary | ICD-10-CM | POA: Diagnosis present

## 2018-03-16 LAB — COMPREHENSIVE METABOLIC PANEL
ALT: 19 U/L (ref 0–44)
AST: 21 U/L (ref 15–41)
Albumin: 3 g/dL — ABNORMAL LOW (ref 3.5–5.0)
Alkaline Phosphatase: 149 U/L — ABNORMAL HIGH (ref 38–126)
Anion gap: 9 (ref 5–15)
BUN: 7 mg/dL (ref 6–20)
CO2: 22 mmol/L (ref 22–32)
CREATININE: 0.46 mg/dL (ref 0.44–1.00)
Calcium: 8.8 mg/dL — ABNORMAL LOW (ref 8.9–10.3)
Chloride: 103 mmol/L (ref 98–111)
Glucose, Bld: 96 mg/dL (ref 70–99)
Potassium: 4 mmol/L (ref 3.5–5.1)
Sodium: 134 mmol/L — ABNORMAL LOW (ref 135–145)
Total Bilirubin: 0.4 mg/dL (ref 0.3–1.2)
Total Protein: 6.9 g/dL (ref 6.5–8.1)

## 2018-03-16 LAB — URINALYSIS, ROUTINE W REFLEX MICROSCOPIC
Bilirubin Urine: NEGATIVE
Glucose, UA: NEGATIVE mg/dL
Ketones, ur: NEGATIVE mg/dL
NITRITE: NEGATIVE
PH: 7 (ref 5.0–8.0)
Protein, ur: NEGATIVE mg/dL
SPECIFIC GRAVITY, URINE: 1.003 — AB (ref 1.005–1.030)

## 2018-03-16 LAB — URIC ACID: URIC ACID, SERUM: 3.3 mg/dL (ref 2.5–7.1)

## 2018-03-16 LAB — CBC
HEMATOCRIT: 33.3 % — AB (ref 36.0–46.0)
HEMOGLOBIN: 11 g/dL — AB (ref 12.0–15.0)
MCH: 27.8 pg (ref 26.0–34.0)
MCHC: 33 g/dL (ref 30.0–36.0)
MCV: 84.3 fL (ref 78.0–100.0)
Platelets: 353 10*3/uL (ref 150–400)
RBC: 3.95 MIL/uL (ref 3.87–5.11)
RDW: 13.6 % (ref 11.5–15.5)
WBC: 13.9 10*3/uL — AB (ref 4.0–10.5)

## 2018-03-16 LAB — TYPE AND SCREEN
ABO/RH(D): AB POS
Antibody Screen: NEGATIVE

## 2018-03-16 MED ORDER — EPHEDRINE 5 MG/ML INJ: 10.0000 mg | INTRAVENOUS | Status: DC | PRN

## 2018-03-16 MED ORDER — FENTANYL CITRATE (PF) 100 MCG/2ML IJ SOLN
50.0000 ug | INTRAMUSCULAR | Status: DC | PRN
  Administered 2018-03-17: 50 ug via INTRAVENOUS
  Filled 2018-03-16: qty 2

## 2018-03-16 MED ORDER — BUTORPHANOL TARTRATE 1 MG/ML IJ SOLN
1.0000 mg | INTRAMUSCULAR | Status: DC | PRN
  Administered 2018-03-16 (×2): 1 mg via INTRAVENOUS
  Filled 2018-03-16 (×2): qty 1

## 2018-03-16 MED ORDER — FENTANYL 2.5 MCG/ML BUPIVACAINE 1/10 % EPIDURAL INFUSION (WH - ANES)
14.0000 mL/h | INTRAMUSCULAR | Status: DC | PRN
  Administered 2018-03-17 (×2): 14 mL/h via EPIDURAL
  Filled 2018-03-16 (×2): qty 100

## 2018-03-16 MED ORDER — MISOPROSTOL 25 MCG QUARTER TABLET
25.0000 ug | ORAL_TABLET | ORAL | Status: AC | PRN
  Administered 2018-03-16 (×2): 25 ug via VAGINAL
  Filled 2018-03-16 (×2): qty 1

## 2018-03-16 MED ORDER — OXYTOCIN 40 UNITS IN LACTATED RINGERS INFUSION - SIMPLE MED: 1.0000 m[IU]/min | INTRAVENOUS | Status: DC

## 2018-03-16 MED ORDER — LIDOCAINE HCL (PF) 1 % IJ SOLN
30.0000 mL | INTRAMUSCULAR | Status: DC | PRN
  Filled 2018-03-16: qty 30

## 2018-03-16 MED ORDER — PENICILLIN G 3 MILLION UNITS IVPB - SIMPLE MED
3.0000 10*6.[IU] | INTRAVENOUS | Status: DC
  Administered 2018-03-16 – 2018-03-17 (×7): 3 10*6.[IU] via INTRAVENOUS
  Filled 2018-03-16 (×6): qty 100

## 2018-03-16 MED ORDER — PHENYLEPHRINE 40 MCG/ML (10ML) SYRINGE FOR IV PUSH (FOR BLOOD PRESSURE SUPPORT): 80.0000 ug | PREFILLED_SYRINGE | INTRAVENOUS | Status: DC | PRN

## 2018-03-16 MED ORDER — PHENYLEPHRINE 40 MCG/ML (10ML) SYRINGE FOR IV PUSH (FOR BLOOD PRESSURE SUPPORT)
80.0000 ug | PREFILLED_SYRINGE | INTRAVENOUS | Status: DC | PRN
  Filled 2018-03-16: qty 10

## 2018-03-16 MED ORDER — ACETAMINOPHEN 325 MG PO TABS
650.0000 mg | ORAL_TABLET | ORAL | Status: DC | PRN
  Administered 2018-03-17 (×2): 650 mg via ORAL
  Filled 2018-03-16 (×2): qty 2

## 2018-03-16 MED ORDER — DIPHENHYDRAMINE HCL 50 MG/ML IJ SOLN: 12.5000 mg | INTRAMUSCULAR | Status: DC | PRN

## 2018-03-16 MED ORDER — SOD CITRATE-CITRIC ACID 500-334 MG/5ML PO SOLN
30.0000 mL | ORAL | Status: DC | PRN
  Administered 2018-03-17: 30 mL via ORAL
  Filled 2018-03-16: qty 15

## 2018-03-16 MED ORDER — OXYTOCIN 40 UNITS IN LACTATED RINGERS INFUSION - SIMPLE MED: 2.5000 [IU]/h | INTRAVENOUS | Status: DC

## 2018-03-16 MED ORDER — OXYTOCIN BOLUS FROM INFUSION: 500.0000 mL | Freq: Once | INTRAVENOUS | Status: DC

## 2018-03-16 MED ORDER — SODIUM CHLORIDE 0.9 % IV SOLN
5.0000 10*6.[IU] | Freq: Once | INTRAVENOUS | Status: AC
  Administered 2018-03-16: 5 10*6.[IU] via INTRAVENOUS
  Filled 2018-03-16: qty 5

## 2018-03-16 MED ORDER — TERBUTALINE SULFATE 1 MG/ML IJ SOLN
0.2500 mg | Freq: Once | INTRAMUSCULAR | Status: AC | PRN
  Administered 2018-03-17: 0.25 mg via SUBCUTANEOUS
  Filled 2018-03-16: qty 1

## 2018-03-16 MED ORDER — ONDANSETRON HCL 4 MG/2ML IJ SOLN
4.0000 mg | Freq: Four times a day (QID) | INTRAMUSCULAR | Status: DC | PRN
  Administered 2018-03-16 – 2018-03-17 (×2): 4 mg via INTRAVENOUS
  Filled 2018-03-16 (×2): qty 2

## 2018-03-16 MED ORDER — LACTATED RINGERS IV SOLN: 500.0000 mL | Freq: Once | INTRAVENOUS | Status: DC

## 2018-03-16 MED ORDER — MISOPROSTOL 25 MCG QUARTER TABLET: 25.0000 ug | ORAL_TABLET | ORAL | Status: DC | PRN

## 2018-03-16 MED ORDER — LACTATED RINGERS IV SOLN
INTRAVENOUS | Status: DC
  Administered 2018-03-16 (×2): via INTRAVENOUS

## 2018-03-16 MED ORDER — LACTATED RINGERS IV SOLN: 500.0000 mL | INTRAVENOUS | Status: DC | PRN

## 2018-03-16 MED ORDER — OXYTOCIN 40 UNITS IN LACTATED RINGERS INFUSION - SIMPLE MED
1.0000 m[IU]/min | INTRAVENOUS | Status: DC
  Filled 2018-03-16: qty 1000

## 2018-03-16 MED ORDER — OXYTOCIN 40 UNITS IN LACTATED RINGERS INFUSION - SIMPLE MED
1.0000 m[IU]/min | INTRAVENOUS | Status: DC
  Administered 2018-03-16: 2 m[IU]/min via INTRAVENOUS

## 2018-03-16 NOTE — Progress Notes (Signed)
   03/16/18 0118  Provider Notification  Provider Name/Title Dr. Amado NashAlmquist  Method of Notification Phone  Notification Reason Other (Comment) [facility]  Dr. Amado NashAlmquist wants pt to be admitted tonight for IOL, L and D charge notified. Pt to be transferred to rm 175

## 2018-03-16 NOTE — Progress Notes (Signed)
S: Doing well, no complaints, pain well controlled with Stadol x 2, though last Stadol dose caused panic attack. Trying to labor without epidural  O: BP (!) 144/78   Pulse 90   Temp 97.8 F (36.6 C) (Oral)   Resp 18   Ht 5\' 8"  (1.727 m)   Wt 101.2 kg   LMP 06/29/2017   SpO2 99%   BMI 33.91 kg/m    FHT:  FHR: 145s bpm, variability: moderate,  accelerations:  Present,  decelerations:  Absent UC:   irritibility q 2 min SVE:   Dilation: 4 Effacement (%): 50 Station: -2 Exam by:: Dr George HughFogleman Foley still in  A / P:  22 y.o.  OB History  Gravida Para Term Preterm AB Living  1 0 0 0 0 0  SAB TAB Ectopic Multiple Live Births  0 0 0 0 0   at 238w1d IOL gest htn, significant anxiety  Fetal Wellbeing:  Category I Pain Control:  IV pain meds switch to fentanyl   Anticipated MOD:  NSVD  Lendon ColonelKelly A Ludean Duhart 03/16/2018, 9:24 PM

## 2018-03-16 NOTE — H&P (Signed)
Charlene Schneider is a 22 y.o. G1P0 at 6262w1d presenting for IOL due to gest htn. Pt notes intermittent contractions. Good fetal movement, No vaginal bleeding, not leaking fluid. Continues with chronic HA and intermittent elevated bps.   [facility], had cervical foley placed midday today and pitocin started. Pt notes mild cramping, no HA. Stadol x 1 controlled pain. Poor sleep o/v  [facility] - Unplanned but desired preg. FOB involved initially but unclear if he will be involved. Pt does not appear to have much social support- SW consult - Anxiety. Pt with significant anxiety, has declined therapist due to time constraints. Poorly congtrolled despite Buspar 15 bid, Wellbutrin 200mg  and Lexapro 10mg . Pt continued to need Xanax about weekly for panic attacks. Suspect anxiety has been affecting her bps - Chronic HA, follow with neuro. Used Fiorcet several times per wk - Smoker, decreased from 1ppd to 3 cigs/ day, needing them more when stressed - GBS positive - htn, started at 32 wks, pt has had many eval in office and MAu, labs always neg for PEC. Variable bps, likely anxiety component but enough elevations (no severe range) to start labetalol 200 tid at 33 wks.  - Fetal growth- AGA at 35 wks.    Prenatal Transfer Tool  Maternal Diabetes: No Genetic Screening: Normal Maternal Ultrasounds/Referrals: Normal Fetal Ultrasounds or other Referrals:  None Maternal Substance Abuse:  No Significant Maternal Medications:  None Significant Maternal Lab Results: None     OB History    Gravida  1   Para      Term      Preterm      AB      Living        SAB      TAB      Ectopic      Multiple      Live Births             Past Medical History:  Diagnosis Date  . Anxiety   . Arachnoid cyst   . Chiari malformation type I (HCC)   . Hypertension   . Migraines   . Transient hypertension of pregnancy, third trimester    Past  Surgical History:  Procedure Laterality Date  . NO PAST SURGERIES     Family History: family history includes Cirrhosis in her maternal grandmother; Depression in her mother; Thyroid disease in her maternal grandmother. Social History:  reports that she has been smoking cigarettes. She has been smoking about 0.10 packs per day. She has never used smokeless tobacco. She reports that she does not drink alcohol or use drugs.  Review of Systems - Negative except HA, anxiety    Physical Exam:  Vitals:   03/16/18 1326 03/16/18 1400 03/16/18 1430 03/16/18 1457  BP: 134/75 131/74    Pulse: 93 78    Resp: 18 20 18 20   Temp: 98.2 F (36.8 C)     TempSrc: Oral     Weight:      Height:       Gen: well appearing, lying in bed, no distress, uncomfortable with contractions Abd: gravid, NT, no RUQ pain GU: foley in place, tugged by RN, no movement LE: no edema  CBC    Component Value Date/Time   WBC 13.9 (H) 03/16/2018 0218   RBC 3.95 03/16/2018 0218   HGB 11.0 (L) 03/16/2018 0218   HCT 33.3 (L) 03/16/2018 0218   PLT 353 03/16/2018 [id number]  MCV 84.3 03/16/2018 0218   MCH 27.8 03/16/2018 0218   MCHC 33.0 03/16/2018 0218   RDW 13.6 03/16/2018 0218   LYMPHSABS 2.2 03/03/2018 1939   MONOABS 0.4 03/03/2018 1939   EOSABS 0.2 03/03/2018 1939   BASOSABS 0.0 03/03/2018 1939     Prenatal labs: ABO, Rh: --/--/AB POS (09/22 1610) Antibody: NEG (09/22 0218) Rubella: Immune (03/18 0000) RPR: Nonreactive (03/18 0000)  HBsAg: Negative (03/18 0000)  HIV: Non-reactive (03/18 0000)  GBS: Positive (04/08 0000)  1 hr Glucola 85  Genetic screening nl quad Anatomy US normal   Assessment/Plan: 22 y.o. G1P0 at [redacted]w[redacted]d - gest htn. R/o PEC, move to delivery - GBS pos, plan PCN - IOL, plan cytotec x 1 then foley/ pit - Anxiety, poorly controlled, young single mom with low support- SW consult   Lendon Colonel 03/16/2018 3:05 PM

## 2018-03-16 NOTE — Progress Notes (Signed)
Pt comfortable Denies lof/vb/not really feeling ctx No h/a, vision changes, ruq pain  Patient Vitals for the past 24 hrs:  BP Temp Temp src Pulse Resp Height Weight  03/16/18 1132 134/74 98.3 F (36.8 C) Oral 85 18 - -  03/16/18 1118 - - - - 20 - -  03/16/18 0852 138/61 - - 92 18 - -  03/16/18 0725 124/81 98.2 F (36.8 C) Oral 95 18 - -  03/16/18 0705 118/65 - - 97 16 - -  03/16/18 0527 108/61 98.1 F (36.7 C) Oral 92 16 - -  03/16/18 0349 (!) 115/53 - - 96 18 - -  03/16/18 0301 128/71 - - 93 18 - -  03/16/18 0225 129/77 - - 99 18 - -  03/16/18 0223 - 98.4 F (36.9 C) Oral - - - -  03/16/18 0206 - - - - - 5\' 8"  (1.727 m) 101.2 kg  03/16/18 0133 139/88 - - (!) 105 18 - -  03/16/18 0117 (!) 141/87 - - (!) 103 - - -  03/16/18 0102 140/81 - - (!) 104 - - -  03/16/18 0048 (!) 156/96 - - (!) 110 - - -  03/16/18 0043 - 98.1 F (36.7 C) - - 18 5\' 8"  (1.727 m) 101.2 kg   A&ox3 Normal respirations Abd: soft, nt, graivd Cx: 1-2, thick, -3; cephalic;   balloon catheter placed and filled with 60cc NS; pt tolerated well LE: trace edema, nt bilat  FHT: 140s, nml variability, +accels, no decels TOCO: irreg ctx  A/P; iup at 37.1 wga 1.  GHTN - asympomatic, labs pending; bps mostly wnl; here for iol d/t ghtn; s/p cytotec, now with foley bulb, pt now tearful and will begin pitocin in about 2 hrs after pt adjusting to foley bulb; plan for pitocin with foley bulb; pt currently not wanting epidural and planning iv pain medication 2. tob use 3. Anxiety 4. Significant social stressors with FOB and family 5. tob use 6. gbs pos - plan pcn for prophylaxis 7. Fetal status reassuring

## 2018-03-16 NOTE — MAU Note (Addendum)
Pt was set up for induction for HTN and told to be here at 2345 tonight. When checked with BS had been changed to 0700 Sunday but had been mix up with office. Pt states her b/p has been high all day. She lives an hour away and is very nervous about everything. States had h/a earlier but took Tylenol and it went away.

## 2018-03-16 NOTE — Anesthesia Pain Management Evaluation Note (Signed)
  CRNA Pain Management Visit Note  Patient: Charlene AmenMelissa Behrendt, 22 y.o., female  "Hello I am a [facility]. We have an anesthesia team available at all times to provide care throughout the hospital, including epidural management and anesthesia for C-section. I don't know your plan for the delivery whether it a natural birth, water birth, IV sedation, nitrous supplementation, doula or epidural, but we want to meet your pain goals."   1.Was your pain managed to your expectations on prior hospitalizations?   No prior hospitalizations  2.What is your expectation for pain management during this hospitalization?     IV pain meds  3.How can we help you reach that goal? Support prn  Record the patient's initial score and the patient's pain goal.   Pain: 1  Pain Goal: 6 [facility] wants you to be able to say your pain was always managed very well.  [facility] CenterWRINKLE,Kayston Jodoin 03/16/2018

## 2018-03-16 NOTE — Progress Notes (Signed)
Pt's mother called out. Pt c/o not feeling good. Pt reports feeling she like she is shaking back and forth and her skin is tingly. Pt received Stadol at 1643 (1mg ). Pt states she felt like this when she smoked marijuana and she did not like the feeling. Pt did not have this reaction with the first dose of Stadol. Pt has difficulty keeping eyes open to talk to nurse about incident. Pulse ox 100%. Pt reassured and vital signs monitored. Mother at bs comforting pt. Pt has been awake since arriving at midnight.

## 2018-03-17 ENCOUNTER — Inpatient Hospital Stay (HOSPITAL_COMMUNITY): Payer: BLUE CROSS/BLUE SHIELD | Admitting: Anesthesiology

## 2018-03-17 ENCOUNTER — Encounter (HOSPITAL_COMMUNITY)
Admission: AD | Disposition: A | Discharge status: Home / Self Care | Payer: Self-pay | Source: Home / Self Care | Attending: Obstetrics

## 2018-03-17 ENCOUNTER — Encounter (HOSPITAL_COMMUNITY): Payer: Self-pay | Admitting: Certified Registered Nurse Anesthetist

## 2018-03-17 DIAGNOSIS — Z98891 History of uterine scar from previous surgery: Secondary | ICD-10-CM

## 2018-03-17 DIAGNOSIS — O139 Gestational [pregnancy-induced] hypertension without significant proteinuria, unspecified trimester: Secondary | ICD-10-CM | POA: Diagnosis present

## 2018-03-17 LAB — CBC
HCT: 34.4 % — ABNORMAL LOW (ref 36.0–46.0)
HCT: 31.6 % — ABNORMAL LOW (ref 36.0–46.0)
HEMOGLOBIN: 11.3 g/dL — AB (ref 12.0–15.0)
HEMOGLOBIN: 10.8 g/dL — AB (ref 12.0–15.0)
MCH: 27.9 pg (ref 26.0–34.0)
MCH: 28.6 pg (ref 26.0–34.0)
MCHC: 32.8 g/dL (ref 30.0–36.0)
MCHC: 34.2 g/dL (ref 30.0–36.0)
MCV: 84.9 fL (ref 78.0–100.0)
MCV: 83.6 fL (ref 78.0–100.0)
PLATELETS: 325 10*3/uL (ref 150–400)
Platelets: 293 10*3/uL (ref 150–400)
RBC: 4.05 MIL/uL (ref 3.87–5.11)
RBC: 3.78 MIL/uL — AB (ref 3.87–5.11)
RDW: 13.7 % (ref 11.5–15.5)
RDW: 13.4 % (ref 11.5–15.5)
WBC: 16.1 10*3/uL — ABNORMAL HIGH (ref 4.0–10.5)
WBC: 20.3 10*3/uL — ABNORMAL HIGH (ref 4.0–10.5)

## 2018-03-17 LAB — RPR: RPR Ser Ql: NONREACTIVE

## 2018-03-17 SURGERY — Surgical Case
Anesthesia: Epidural

## 2018-03-17 MED ORDER — SIMETHICONE 80 MG PO CHEW
80.0000 mg | CHEWABLE_TABLET | Freq: Three times a day (TID) | ORAL | Status: DC
  Administered 2018-03-18 – 2018-03-20 (×5): 80 mg via ORAL
  Filled 2018-03-17 (×7): qty 1

## 2018-03-17 MED ORDER — LIDOCAINE HCL (PF) 1 % IJ SOLN
INTRAMUSCULAR | Status: DC | PRN
  Administered 2018-03-17: 13 mL via EPIDURAL

## 2018-03-17 MED ORDER — MORPHINE SULFATE (PF) 0.5 MG/ML IJ SOLN
INTRAMUSCULAR | Status: DC | PRN
  Administered 2018-03-17: 4 mg via EPIDURAL

## 2018-03-17 MED ORDER — LIDOCAINE-EPINEPHRINE (PF) 2 %-1:200000 IJ SOLN
INTRAMUSCULAR | Status: DC | PRN
  Administered 2018-03-17 (×3): 5 mL via EPIDURAL

## 2018-03-17 MED ORDER — ONDANSETRON HCL 4 MG/2ML IJ SOLN
INTRAMUSCULAR | Status: DC | PRN
  Administered 2018-03-17: 4 mg via INTRAVENOUS

## 2018-03-17 MED ORDER — KETOROLAC TROMETHAMINE 30 MG/ML IJ SOLN: 30.0000 mg | Freq: Once | INTRAMUSCULAR | Status: DC | PRN

## 2018-03-17 MED ORDER — METOCLOPRAMIDE HCL 5 MG/ML IJ SOLN
INTRAMUSCULAR | Status: DC | PRN
  Administered 2018-03-17: 10 mg via INTRAVENOUS

## 2018-03-17 MED ORDER — OXYTOCIN 40 UNITS IN LACTATED RINGERS INFUSION - SIMPLE MED: 2.5000 [IU]/h | INTRAVENOUS | Status: DC

## 2018-03-17 MED ORDER — DIPHENHYDRAMINE HCL 25 MG PO CAPS
25.0000 mg | ORAL_CAPSULE | Freq: Four times a day (QID) | ORAL | Status: DC | PRN
  Administered 2018-03-17: 25 mg via ORAL
  Filled 2018-03-17: qty 1

## 2018-03-17 MED ORDER — DEXAMETHASONE SODIUM PHOSPHATE 4 MG/ML IJ SOLN
INTRAMUSCULAR | Status: DC | PRN
  Administered 2018-03-17: 4 mg via INTRAVENOUS

## 2018-03-17 MED ORDER — MENTHOL 3 MG MT LOZG: 1.0000 | LOZENGE | OROMUCOSAL | Status: DC | PRN

## 2018-03-17 MED ORDER — WITCH HAZEL-GLYCERIN EX PADS
1.0000 "application " | MEDICATED_PAD | CUTANEOUS | Status: DC | PRN
  Administered 2018-03-18: 1 via TOPICAL

## 2018-03-17 MED ORDER — OXYTOCIN 10 UNIT/ML IJ SOLN
INTRAVENOUS | Status: DC | PRN
  Administered 2018-03-17: 40 [IU] via INTRAVENOUS

## 2018-03-17 MED ORDER — DEXAMETHASONE SODIUM PHOSPHATE 4 MG/ML IJ SOLN
INTRAMUSCULAR | Status: AC
  Filled 2018-03-17: qty 1

## 2018-03-17 MED ORDER — METOCLOPRAMIDE HCL 5 MG/ML IJ SOLN
INTRAMUSCULAR | Status: AC
  Filled 2018-03-17: qty 2

## 2018-03-17 MED ORDER — TETANUS-DIPHTH-ACELL PERTUSSIS 5-2.5-18.5 LF-MCG/0.5 IM SUSP: 0.5000 mL | Freq: Once | INTRAMUSCULAR | Status: DC

## 2018-03-17 MED ORDER — SENNOSIDES-DOCUSATE SODIUM 8.6-50 MG PO TABS
2.0000 | ORAL_TABLET | ORAL | Status: DC
  Administered 2018-03-17 – 2018-03-20 (×3): 2 via ORAL
  Filled 2018-03-17 (×3): qty 2

## 2018-03-17 MED ORDER — IBUPROFEN 600 MG PO TABS
600.0000 mg | ORAL_TABLET | Freq: Four times a day (QID) | ORAL | Status: DC
  Administered 2018-03-17 – 2018-03-20 (×12): 600 mg via ORAL
  Filled 2018-03-17 (×12): qty 1

## 2018-03-17 MED ORDER — ZOLPIDEM TARTRATE 5 MG PO TABS: 5.0000 mg | ORAL_TABLET | Freq: Every evening | ORAL | Status: DC | PRN

## 2018-03-17 MED ORDER — PRENATAL MULTIVITAMIN CH
1.0000 | ORAL_TABLET | Freq: Every day | ORAL | Status: DC
  Administered 2018-03-18 – 2018-03-20 (×3): 1 via ORAL
  Filled 2018-03-17 (×3): qty 1

## 2018-03-17 MED ORDER — SIMETHICONE 80 MG PO CHEW
80.0000 mg | CHEWABLE_TABLET | ORAL | Status: DC | PRN
  Administered 2018-03-18: 80 mg via ORAL

## 2018-03-17 MED ORDER — ONDANSETRON HCL 4 MG/2ML IJ SOLN
INTRAMUSCULAR | Status: AC
  Filled 2018-03-17: qty 2

## 2018-03-17 MED ORDER — COCONUT OIL OIL
1.0000 "application " | TOPICAL_OIL | Status: DC | PRN
  Filled 2018-03-17: qty 120

## 2018-03-17 MED ORDER — SCOPOLAMINE 1 MG/3DAYS TD PT72
MEDICATED_PATCH | TRANSDERMAL | Status: AC
  Filled 2018-03-17: qty 1

## 2018-03-17 MED ORDER — OXYCODONE HCL 5 MG PO TABS
5.0000 mg | ORAL_TABLET | ORAL | Status: DC | PRN
  Administered 2018-03-18 – 2018-03-19 (×3): 5 mg via ORAL
  Filled 2018-03-17 (×3): qty 1

## 2018-03-17 MED ORDER — SCOPOLAMINE 1 MG/3DAYS TD PT72
MEDICATED_PATCH | TRANSDERMAL | Status: DC | PRN
  Administered 2018-03-17: 1 via TRANSDERMAL

## 2018-03-17 MED ORDER — MEPERIDINE HCL 25 MG/ML IJ SOLN
INTRAMUSCULAR | Status: DC | PRN
  Administered 2018-03-17 (×2): 12.5 mg via INTRAVENOUS

## 2018-03-17 MED ORDER — MORPHINE SULFATE (PF) 0.5 MG/ML IJ SOLN
INTRAMUSCULAR | Status: AC
  Filled 2018-03-17: qty 10

## 2018-03-17 MED ORDER — CEFAZOLIN SODIUM-DEXTROSE 2-3 GM-%(50ML) IV SOLR
INTRAVENOUS | Status: DC | PRN
  Administered 2018-03-17: 2 g via INTRAVENOUS

## 2018-03-17 MED ORDER — SIMETHICONE 80 MG PO CHEW
80.0000 mg | CHEWABLE_TABLET | ORAL | Status: DC
  Administered 2018-03-17 – 2018-03-20 (×3): 80 mg via ORAL
  Filled 2018-03-17 (×3): qty 1

## 2018-03-17 MED ORDER — ACETAMINOPHEN 325 MG PO TABS
650.0000 mg | ORAL_TABLET | ORAL | Status: DC | PRN
  Administered 2018-03-17 – 2018-03-19 (×4): 650 mg via ORAL
  Filled 2018-03-17 (×4): qty 2

## 2018-03-17 MED ORDER — LACTATED RINGERS IV SOLN
INTRAVENOUS | Status: DC | PRN
  Administered 2018-03-17 (×2): via INTRAVENOUS

## 2018-03-17 MED ORDER — DIBUCAINE 1 % RE OINT: 1.0000 "application " | TOPICAL_OINTMENT | RECTAL | Status: DC | PRN

## 2018-03-17 MED ORDER — LACTATED RINGERS IV SOLN
INTRAVENOUS | Status: DC
  Administered 2018-03-17 – 2018-03-18 (×2): via INTRAVENOUS

## 2018-03-17 MED ORDER — SODIUM CHLORIDE 0.9 % IR SOLN
Status: DC | PRN
  Administered 2018-03-17: 1000 mL

## 2018-03-17 MED ORDER — OXYCODONE HCL 5 MG PO TABS
10.0000 mg | ORAL_TABLET | ORAL | Status: DC | PRN
  Administered 2018-03-18 – 2018-03-20 (×5): 10 mg via ORAL
  Filled 2018-03-17 (×5): qty 2

## 2018-03-17 MED ORDER — MEPERIDINE HCL 25 MG/ML IJ SOLN
INTRAMUSCULAR | Status: AC
  Filled 2018-03-17: qty 1

## 2018-03-17 MED ORDER — OXYTOCIN 10 UNIT/ML IJ SOLN
INTRAMUSCULAR | Status: AC
  Filled 2018-03-17: qty 4

## 2018-03-17 MED ORDER — LACTATED RINGERS IV SOLN
INTRAVENOUS | Status: DC | PRN
  Administered 2018-03-17: 12:00:00 via INTRAVENOUS

## 2018-03-17 SURGICAL SUPPLY — 33 items
BENZOIN TINCTURE PRP APPL 2/3 (GAUZE/BANDAGES/DRESSINGS) ×2 IMPLANT
CHLORAPREP W/TINT 26ML (MISCELLANEOUS) ×2 IMPLANT
CLAMP CORD UMBIL (MISCELLANEOUS) IMPLANT
CLOTH BEACON ORANGE TIMEOUT ST (SAFETY) ×2 IMPLANT
DRSG OPSITE POSTOP 4X10 (GAUZE/BANDAGES/DRESSINGS) ×2 IMPLANT
ELECT REM PT RETURN 9FT ADLT (ELECTROSURGICAL) ×2
ELECTRODE REM PT RTRN 9FT ADLT (ELECTROSURGICAL) ×1 IMPLANT
EXTRACTOR VACUUM M CUP 4 TUBE (SUCTIONS) IMPLANT
GLOVE BIO SURGEON STRL SZ 6.5 (GLOVE) ×2 IMPLANT
GLOVE BIOGEL PI IND STRL 7.0 (GLOVE) ×2 IMPLANT
GLOVE BIOGEL PI INDICATOR 7.0 (GLOVE) ×2
GOWN STRL REUS W/TWL LRG LVL3 (GOWN DISPOSABLE) ×4 IMPLANT
KIT ABG SYR 3ML LUER SLIP (SYRINGE) IMPLANT
NEEDLE HYPO 22GX1.5 SAFETY (NEEDLE) IMPLANT
NEEDLE HYPO 25X5/8 SAFETYGLIDE (NEEDLE) IMPLANT
NS IRRIG 1000ML POUR BTL (IV SOLUTION) ×2 IMPLANT
PACK C SECTION WH (CUSTOM PROCEDURE TRAY) ×2 IMPLANT
PAD OB MATERNITY 4.3X12.25 (PERSONAL CARE ITEMS) ×2 IMPLANT
PENCIL SMOKE EVAC W/HOLSTER (ELECTROSURGICAL) ×2 IMPLANT
SPONGE LAP 18X18 RF (DISPOSABLE) ×6 IMPLANT
STRIP CLOSURE SKIN 1/2X4 (GAUZE/BANDAGES/DRESSINGS) ×2 IMPLANT
SUT MON AB 4-0 PS1 27 (SUTURE) ×2 IMPLANT
SUT PLAIN 0 NONE (SUTURE) IMPLANT
SUT PLAIN 2 0 XLH (SUTURE) ×2 IMPLANT
SUT VIC AB 0 CT1 36 (SUTURE) ×4 IMPLANT
SUT VIC AB 0 CTX 36 (SUTURE) ×2
SUT VIC AB 0 CTX36XBRD ANBCTRL (SUTURE) ×2 IMPLANT
SUT VIC AB 2-0 CT1 27 (SUTURE) ×1
SUT VIC AB 2-0 CT1 TAPERPNT 27 (SUTURE) ×1 IMPLANT
SUT VIC AB 4-0 KS 27 (SUTURE) ×2 IMPLANT
SYR CONTROL 10ML LL (SYRINGE) IMPLANT
TOWEL OR 17X24 6PK STRL BLUE (TOWEL DISPOSABLE) ×2 IMPLANT
TRAY FOLEY W/BAG SLVR 14FR LF (SET/KITS/TRAYS/PACK) IMPLANT

## 2018-03-17 NOTE — Op Note (Signed)
03/16/2018 - 03/17/2018  12:27 PM  PATIENT:  Charlene Schneider  22 y.o. female  PRE-OPERATIVE DIAGNOSIS:  arrest of dilation  POST-OPERATIVE DIAGNOSIS:  arrest of dilation  PROCEDURE:  Procedure(s): CESAREAN SECTION (N/A)  PCS, LTCS, 2 layer closure  SURGEON:  Surgeon(s) and Role:    * Noland FordyceFogleman, Thresea Doble, MD - Primary  PHYSICIAN ASSISTANT:   ASSISTANTS: Sigmon, CNM   ANESTHESIA:   epidural  EBL:  Per nursing notes    BLOOD ADMINISTERED:none  DRAINS: Urinary Catheter (Foley)   LOCAL MEDICATIONS USED:  NONE  SPECIMEN:  Source of Specimen:  placenta  DISPOSITION OF SPECIMEN:  L&D  COUNTS:  YES  TOURNIQUET:  * No tourniquets in log *  DICTATION: .Note written in EPIC  PLAN OF CARE: Admit to inpatient   PATIENT DISPOSITION:  PACU - hemodynamically stable.   Delay start of Pharmacological VTE agent (>24hrs) due to surgical blood loss or risk of bleeding: yes   Findings:  @BABYSEXEBC @ infant,  APGAR (1 MIN): 9   APGAR (5 MINS): 9   APGAR (10 MINS):   Normal uterus, tubes and ovaries, normal placenta. 3VC, clear amniotic fluid  EBL: per nursing notes/ cc Antibiotics:   2g Ancef Complications: none  Indications: This is a 22 y.o. year-old, G1  At 740w2d admitted for IOL for gest htn. Initial slow progress then raPID progress to 9cm, accompanied by 2 separate runs of prolonged decels. Pt with no further progress over 4+ hours, no descent and decision for PCS. Risks benefits and alternatives of the procedure were discussed with the patient who agreed to proceed  Procedure:  After informed consent was obtained the patient was taken to the operating room where epidural anesthesia was initiaed.  She was prepped and draped in the normal sterile fashion in dorsal supine position with a leftward tilt.  A foley catheter was in place.  A Pfannenstiel skin incision was made 2 cm above the pubic symphysis in the midline with the scalpel.  Dissection was carried down with the Bovie  cautery until the fascia was reached. The fascia was incised in the midline. The incision was extended laterally with the Mayo scissors. The inferior aspect of the fascial incision was grasped with the Coker clamps, elevated up and the underlying rectus muscles were dissected off sharply. The superior aspect of the fascial incision was grasped with the Coker clamps elevated up and the underlying rectus muscles were dissected off sharply.  The peritoneum was entered bluntly. The peritoneal incision was extended superiorly and inferiorly with good visualization of the bladder. The bladder blade was inserted and palpation was done to assess the fetal position and the location of the uterine vessels. A bladder flap was created sharply. The lower segment of the uterus was incised sharply with the scalpel and extended  bluntly in the cephalo-caudal fashion. A nurse placed a hand in the vagina to elevate the fetal vertex. The infant was grasped, brought to the incision,  rotated and the infant was delivered with fundal pressure. The nose and mouth were bulb suctioned. The cord was clamped and cut after 1 minute delay. The infant was handed off to the waiting pediatrician. The placenta was expressed. The uterus was left in-situ.  The uterus was cleared of all clots and debris. The uterine incision was repaired with 0 Vicryl in a running locked fashion.  A second layer of the same suture was used in an imbricating fashion to obtain excellent hemostasis. A single figure of 8 was placed  just below the incision, in the midline.   The uterine incision was reinspected and found to be hemostatic. The peritoneum was grasped and closed with 2-0 Vicryl in a running fashion. The cut muscle edges and the underside of the fascia were inspected and found to be hemostatic. The fascia was closed with 0 Vicryl in a single layer. The subcutaneous tissue was irrigated. Scarpa's layer was closed with a 2-0 plain gut suture. The skin was closed  with a 4-0 Vicryll in a single layer. The patient tolerated the procedure well. Sponge lap and needle counts were correct x3 and patient was taken to the recovery room in a stable condition.  Lendon Colonel 03/17/2018 12:28 PM

## 2018-03-17 NOTE — Progress Notes (Signed)
S: Doing well, no complaints, pain well controlled and now accepting epidural  O: BP 139/81   Pulse 82   Temp 98.3 F (36.8 C) (Oral)   Resp 18   Ht 5\' 8"  (1.727 m)   Wt 101.2 kg   LMP 06/29/2017   SpO2 99%   BMI 33.91 kg/m    FHT:  FHR: 135s  bpm, variability: moderate,  accelerations:  Present,  decelerations:  Absent UC:   regular, every 2-3 minutes SVE:   Dilation: 6 Effacement (%): 60 Station: -2 Exam by:: Dr. Ernestina PennaFogleman IUPC placed (SROM on own at [place])  A / P:  22 y.o.  OB History  Gravida Para Term Preterm AB Living  1 0 0 0 0 0  SAB TAB Ectopic Multiple Live Births  0 0 0 0 0   at 2870w2d IOL at 37 wks for gest htn  Fetal Wellbeing:  Category I Pain Control:  Epidural  Anticipated MOD:  working towards vaginal delivery  Charlene Schneider 03/17/2018, 1:29 AM

## 2018-03-17 NOTE — Brief Op Note (Signed)
03/16/2018 - 03/17/2018  12:27 PM  PATIENT:  Kharma Adsit  22 y.o. female  PRE-OPERATIVE DIAGNOSIS:  arrest of dilation  POST-OPERATIVE DIAGNOSIS:  arrest of dilation  PROCEDURE:  Procedure(s): CESAREAN SECTION (N/A)  PCS, LTCS, 2 layer closure  SURGEON:  Surgeon(s) and Role:    * Noland FordyceFogleman, Breslin Hemann, MD - Primary  PHYSICIAN ASSISTANT:   ASSISTANTS: Sigmon, CNM   ANESTHESIA:   epidural  EBL:  Per nursing notes    BLOOD ADMINISTERED:none  DRAINS: Urinary Catheter (Foley)   LOCAL MEDICATIONS USED:  NONE  SPECIMEN:  Source of Specimen:  placenta  DISPOSITION OF SPECIMEN:  L&D  COUNTS:  YES  TOURNIQUET:  * No tourniquets in log *  DICTATION: .Note written in EPIC  PLAN OF CARE: Admit to inpatient   PATIENT DISPOSITION:  PACU - hemodynamically stable.   Delay start of Pharmacological VTE agent (>24hrs) due to surgical blood loss or risk of bleeding: yes

## 2018-03-17 NOTE — Lactation Note (Signed)
This note was copied from a baby's chart. Lactation Consultation Note  Patient Name: Charlene Schneider GNFAO'ZToday's Date: 03/17/2018 Reason for consult: Initial assessment;Primapara;1st time breastfeeding;Early term 37-38.6wks  P1 mother whose infant is now 595 hours old.  This is an ETI at 37+2 weeks.  Baby was sleeping in the bassinet as I arrived and not showing feeding cues.  Encouraged mother to feed 8-12 times/24 hours or sooner if baby shows cues.  Reviewed cues.  Mother is familiar with hand expression and I encouraged this before/after feedings.  Colostrum container provided for any EBM she may obtain with hand expression.  Milk storage times reviewed.  Mother is having some difficulty latching on deeply and I suggested she call her RN/LC the next time baby shows feeding cues and we can assist with latching.    Mom made aware of O/P services, breastfeeding support groups, community resources, and our phone # for post-discharge questions.   [facility] but plans to make an appointment tomorrow.  She also stated that she needs to call her insurance company to obtain a DEBP.  She commented on how exhausted she is now but will make her phone calls and appointments in the a.m.    Maternal Data Formula Feeding for Exclusion: No Has patient been taught Hand Expression?: Yes Does the patient have breastfeeding experience prior to this delivery?: No  Feeding Feeding Type: Breast Fed Length of feed: 20 min  LATCH Score Latch: Grasps breast easily, tongue down, lips flanged, rhythmical sucking.  Audible Swallowing: A few with stimulation  Type of Nipple: Everted at rest and after stimulation  Comfort (Breast/Nipple): Soft / non-tender  Hold (Positioning): Assistance needed to correctly position infant at breast and maintain latch.  LATCH Score: 8  Interventions Interventions: Breast massage;Skin to skin;Assisted with latch;Breast feeding basics  reviewed;Hand express  Lactation Tools Discussed/Used WIC Program: Yes   Consult Status Consult Status: Follow-up Date: 03/18/18 Follow-up type: In-patient    Dora SimsBeth R Malerie Eakins 03/17/2018, 5:17 PM

## 2018-03-17 NOTE — Lactation Note (Signed)
This note was copied from a baby's chart. Lactation Consultation Note  Patient Name: Girl Betsey AmenMelissa Delaurentis WUJWJ'XToday's Date: 03/17/2018 Reason for consult: Follow-up assessment;Early term 37-38.6wks   P1, Baby 10 hours old.  9237w2d Mother is hot and exhausted but willing to try latching baby.  Mother has excellent flow of colostrum.  Mother and LC expressed approx 8 ml of colostrum. Attempted latching baby in both cross cradle and football hold. Baby is tongue thrusting with tight suck.  Taught suck training and encouraged tummy time when awake. Baby latched briefly and became sleepy. Finger syringe fed with FOB approx 7 ml.  Reviewed spoon feeding. Mom encouraged to feed baby 8-12 times/24 hours and with feeding cues.  Set up DEPB.  RN will review later due to mother's exhaustion.    Maternal Data    Feeding Feeding Type: Breast Fed Length of feed: 5 min  LATCH Score Latch: Repeated attempts needed to sustain latch, nipple held in mouth throughout feeding, stimulation needed to elicit sucking reflex.  Audible Swallowing: A few with stimulation  Type of Nipple: Everted at rest and after stimulation  Comfort (Breast/Nipple): Soft / non-tender  Hold (Positioning): Assistance needed to correctly position infant at breast and maintain latch.  LATCH Score: 7  Interventions Interventions: Breast feeding basics reviewed;Assisted with latch;Skin to skin;Hand express;Breast compression;Support pillows;Position options;Expressed milk;DEBP  Lactation Tools Discussed/Used Pump Review: Milk Storage Initiated by:: Dahlia Byesuth Berkelhammer RN (RN to do teaching when mother feels better & is rested) Date initiated:: 03/17/18   Consult Status Consult Status: Follow-up Date: 03/18/18 Follow-up type: In-patient    Dahlia ByesBerkelhammer, [facility] 03/17/2018, 9:55 PM

## 2018-03-17 NOTE — Transfer of Care (Signed)
Immediate Anesthesia Transfer of Care Note  Patient: Charlene Schneider  Procedure(s) Performed: CESAREAN SECTION (N/A )  Patient Location: PACU  Anesthesia Type:Epidural  Level of Consciousness: awake, alert  and patient cooperative  Airway & Oxygen Therapy: Patient Spontanous Breathing  Post-op Assessment: Report given to RN and Post -op Vital signs reviewed and stable  Post vital signs: Reviewed and stable  Last Vitals:  Vitals Value Taken Time  BP 121/79 03/17/2018 12:48 PM  Temp    Pulse 100 03/17/2018 12:49 PM  Resp 14 03/17/2018 12:49 PM  SpO2 96 % 03/17/2018 12:49 PM  Vitals shown include unvalidated device data.  Last Pain:  Vitals:   03/17/18 0902  TempSrc: Oral  PainSc:       Patients Stated Pain Goal: 4 (03/17/18 0033)  Complications: No apparent anesthesia complications

## 2018-03-17 NOTE — Anesthesia Preprocedure Evaluation (Signed)
Anesthesia Evaluation  Patient identified by MRN, date of birth, ID band Patient awake    Reviewed: Allergy & Precautions, NPO status , Patient's Chart, lab work & pertinent test results  Airway Mallampati: II  TM Distance: >3 FB Neck ROM: Full    Dental no notable dental hx.    Pulmonary neg pulmonary ROS, Current Smoker,    Pulmonary exam normal breath sounds clear to auscultation       Cardiovascular hypertension, negative cardio ROS Normal cardiovascular exam Rhythm:Regular Rate:Normal     Neuro/Psych negative neurological ROS  negative psych ROS   GI/Hepatic negative GI ROS, Neg liver ROS,   Endo/Other  negative endocrine ROS  Renal/GU negative Renal ROS  negative genitourinary   Musculoskeletal negative musculoskeletal ROS (+)   Abdominal   Peds negative pediatric ROS (+)  Hematology negative hematology ROS (+)   Anesthesia Other Findings   Reproductive/Obstetrics negative OB ROS (+) Pregnancy                             Anesthesia Physical Anesthesia Plan  ASA: II  Anesthesia Plan: Epidural   Post-op Pain Management:    Induction:   PONV Risk Score and Plan:   Airway Management Planned:   Additional Equipment:   Intra-op Plan:   Post-operative Plan:   Informed Consent:   Plan Discussed with:   Anesthesia Plan Comments:         Anesthesia Quick Evaluation

## 2018-03-17 NOTE — Anesthesia Postprocedure Evaluation (Signed)
Anesthesia Post Note  Patient: Charlene Schneider  Procedure(s) Performed: CESAREAN SECTION (N/A )     Patient location during evaluation: PACU Anesthesia Type: Epidural Level of consciousness: awake and alert Pain management: pain level controlled Vital Signs Assessment: post-procedure vital signs reviewed and stable Respiratory status: spontaneous breathing, nonlabored ventilation and respiratory function stable Cardiovascular status: stable Postop Assessment: no headache, no backache and epidural receding Anesthetic complications: no    Last Vitals:  Vitals:   03/17/18 1330 03/17/18 1355  BP: 107/62 114/72  Pulse: 96 93  Resp: 16 18  Temp:    SpO2: 98% 97%    Last Pain:  Vitals:   03/17/18 1400  TempSrc:   PainSc: 0-No pain   Pain Goal: Patients Stated Pain Goal: 4 (03/17/18 0033)               Kip Kautzman L Pippa Hanif

## 2018-03-17 NOTE — Anesthesia Postprocedure Evaluation (Addendum)
Anesthesia Post Note  Patient: Charlene Schneider  Procedure(s) Performed: CESAREAN SECTION (N/A )     Patient location during evaluation: Mother Baby Anesthesia Type: Epidural Level of consciousness: awake and alert and oriented Pain management: satisfactory to patient Vital Signs Assessment: post-procedure vital signs reviewed and stable Respiratory status: respiratory function stable Cardiovascular status: stable Postop Assessment: no headache, no backache, epidural receding, patient able to bend at knees, no signs of nausea or vomiting and adequate PO intake Anesthetic complications: no    Last Vitals:  Vitals:   03/17/18 1355 03/17/18 1520  BP: 114/72 121/82  Pulse: 93 83  Resp: 18 18  Temp:  37.1 C  SpO2: 97% 97%    Last Pain:  Vitals:   03/17/18 1520  TempSrc:   PainSc: 0-No pain   Pain Goal: Patients Stated Pain Goal: 4 (03/17/18 0033)               Karleen DolphinFUSSELL,Kambree Krauss

## 2018-03-17 NOTE — Anesthesia Procedure Notes (Signed)
Epidural Patient location during procedure: OB Start time: 03/17/2018 1:54 AM End time: 03/17/2018 2:06 AM  Staffing Anesthesiologist: Lowella CurbMiller, Kamie Korber Ray, MD Performed: anesthesiologist   Preanesthetic Checklist Completed: patient identified, site marked, surgical consent, pre-op evaluation, timeout performed, IV checked, risks and benefits discussed and monitors and equipment checked  Epidural Patient position: sitting Prep: ChloraPrep Patient monitoring: heart rate, cardiac monitor, continuous pulse ox and blood pressure Approach: midline Location: L2-L3 Injection technique: LOR saline  Needle:  Needle type: Tuohy  Needle gauge: 17 G Needle length: 9 cm Needle insertion depth: 6 cm Catheter type: closed end flexible Catheter size: 20 Guage Catheter at skin depth: 10 cm Test dose: negative  Assessment Events: blood not aspirated, injection not painful, no injection resistance, negative IV test and no paresthesia  Additional Notes Reason for block:procedure for pain

## 2018-03-17 NOTE — Consult Note (Signed)
Neonatology Note:   Attendance at C-section:    I was asked by Dr. Ernestina PennaFogleman to attend this primary C/S at 37 2/7 weeks after IOL for gestational HTN (on Labetalol), due to FTP. The mother is a G1P0 AB pos, GBS pos with cigarette smoking (reduced during pregnancy). Mother also has headaches, for which she has been taking Fioricet several times per week, a history of Chiari malformation, and anxiety. ROM 12 hours prior to delivery, fluid clear. Infant vigorous with good spontaneous cry and tone. Delayed cord clamping was done. Needed only minimal bulb suctioning. Ap 9/9. Lungs clear to ausc in DR. Infant is able to remain with her mother for skin to skin time under nursing supervision. Transferred to the care of Pediatrician.   Charlene Souhristie C. Shakerria Parran, MD

## 2018-03-17 NOTE — Addendum Note (Signed)
Addendum  created 03/17/18 1611 by Graciela HusbandsFussell, Sheliah Fiorillo O, CRNA   Sign clinical note

## 2018-03-17 NOTE — Progress Notes (Signed)
S: Doing well, no complaints, pain well controlled with epidural  O: BP 120/63   Pulse 99   Temp 98.7 F (37.1 C) (Oral)   Resp 16   Ht 5\' 8"  (1.727 m)   Wt 101.2 kg   LMP 06/29/2017   SpO2 99%   BMI 33.91 kg/m    FHT:  FHR: 140s bpm, variability: moderate,  accelerations:  Present,  decelerations:  Present late decelsover the past hour then 12 min decel into the 90s. Recovered with terbuatline, hands and knees and stopping pitocin. UC:   regular, every 1.5 minutes, now spacing to q 4-5 after meds SVE:   Dilation: 10 Effacement (%): 70 Station: Plus 1 Exam by:: Sandrea HughsKatie Forsell, RN    A / P:  22 y.o.  OB History  Gravida Para Term Preterm AB Living  1 0 0 0 0 0  SAB TAB Ectopic Multiple Live Births  0 0 0 0 0   at 268w2d IOL, gest htn, significant anxiety. Rapid progress over the last 2 hours accompanied with fetal decels now recovered but decreaased variabilty.  Fetal Wellbeing:  Category II and watch closely, baby now recovering from decels,  Pain Control:  Epidural  Anticipated MOD:  Unclear, moving towards vaginal delivery but recent decent concerning. These were likely due to hyperstim and rapid change.   Lendon ColonelKelly A Friedrich Harriott 03/17/2018, 6:16 AM

## 2018-03-17 NOTE — Progress Notes (Signed)
S: Doing well, no complaints, pain well controlled with epidural  O: BP 126/80   Pulse (!) 104   Temp 98.3 F (36.8 C) (Oral)   Resp 18   Ht 5\' 8"  (1.727 m)   Wt 101.2 kg   LMP 06/29/2017   SpO2 100%   BMI 33.91 kg/m    FHT:  FHR: 140s bpm, variability: moderate,  accelerations:  Present,  decelerations:  Present cluster of variable decels and 1 prolonged, good recovery UC:   regular, every 4 minutes SVE:   Dilation: 9 Effacement (%): 100 Station: 0 Exam by:: Jaylyn Iyer   A / P:  22 y.o.  OB History  Gravida Para Term Preterm AB Living  1 0 0 0 0 0  SAB TAB Ectopic Multiple Live Births  0 0 0 0 0   at 4060w2d IOL gest htn, initially slow progress followed by quick progress to 9cm, now at 9cm x2+ hrs, though during that time terbutaline given, pitocin stopped and now retritrating pitocin. Will give another 1-2 hrs to see if cntinued cervical change of fetal descent will occur.   Fetal Wellbeing:  Category II Pain Control:  Epidural  Anticipated MOD:  working towrds vaginal delivery though pt understand c/s may be needed  Hartford FinancialKelly A Trey Gulbranson 03/17/2018, 8:49 AM

## 2018-03-18 LAB — CBC
HEMATOCRIT: 28 % — AB (ref 36.0–46.0)
Hemoglobin: 9.4 g/dL — ABNORMAL LOW (ref 12.0–15.0)
MCH: 28.2 pg (ref 26.0–34.0)
MCHC: 33.6 g/dL (ref 30.0–36.0)
MCV: 84.1 fL (ref 78.0–100.0)
PLATELETS: 274 10*3/uL (ref 150–400)
RBC: 3.33 MIL/uL — ABNORMAL LOW (ref 3.87–5.11)
RDW: 13.6 % (ref 11.5–15.5)
WBC: 14.7 10*3/uL — ABNORMAL HIGH (ref 4.0–10.5)

## 2018-03-18 LAB — BIRTH TISSUE RECOVERY COLLECTION (PLACENTA DONATION)

## 2018-03-18 MED ORDER — BUPROPION HCL ER (SR) 100 MG PO TB12
200.0000 mg | ORAL_TABLET | Freq: Two times a day (BID) | ORAL | Status: DC
  Administered 2018-03-18 – 2018-03-20 (×5): 200 mg via ORAL
  Filled 2018-03-18 (×8): qty 2

## 2018-03-18 MED ORDER — BUSPIRONE HCL 15 MG PO TABS
15.0000 mg | ORAL_TABLET | Freq: Two times a day (BID) | ORAL | Status: DC
  Administered 2018-03-18 – 2018-03-20 (×5): 15 mg via ORAL
  Filled 2018-03-18 (×7): qty 1

## 2018-03-18 MED ORDER — MAGNESIUM OXIDE 400 (241.3 MG) MG PO TABS
400.0000 mg | ORAL_TABLET | Freq: Every day | ORAL | Status: DC
  Administered 2018-03-18 – 2018-03-20 (×3): 400 mg via ORAL
  Filled 2018-03-18 (×4): qty 1

## 2018-03-18 MED ORDER — ESCITALOPRAM OXALATE 10 MG PO TABS
10.0000 mg | ORAL_TABLET | Freq: Every day | ORAL | Status: DC
Start: 2018-03-18 — End: 2018-03-20
  Administered 2018-03-18 – 2018-03-20 (×3): 10 mg via ORAL
  Filled 2018-03-18 (×4): qty 1

## 2018-03-18 MED ORDER — POLYSACCHARIDE IRON COMPLEX 150 MG PO CAPS
150.0000 mg | ORAL_CAPSULE | Freq: Every day | ORAL | Status: DC
  Administered 2018-03-18 – 2018-03-20 (×3): 150 mg via ORAL
  Filled 2018-03-18 (×3): qty 1

## 2018-03-18 NOTE — Progress Notes (Signed)
CSW met with MOB via bedside to provide any supports needed. MOB was pleasant but tearful during conversation. MOB states FOB is requesting a paternity test at this time and MOB will not be putting FOB on birth certificate. MOB voiced anxiety and feeling overwhelmed due to FOB requesting paternity test. MOB states she had a pretty normal pregnancy however did have a difficult delivery due to an unplanned C-section. MOB voiced no other concerns at this time. 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 CSW went over SIDS and need for a safe sleeping area- baby has crib at home. MOB's mother was present during conversation and questioned if baby could receive medicaid due to MOB being on her insurance- CSW will provide MOB with information on Medicaid application.   CSW provided education regarding Baby Blues vs PMADs and provided MOB with resources for mental health follow up.  CSW encouraged MOB to evaluate her mental health throughout the postpartum period with the use of the New Mom Checklist developed by [facility]partum [facility] as well as the Edinburgh Postnatal Depression Scale and notify a medical professional if symptoms arise.    No other concerns voiced by MOB at this time- CSW will provide information for Medicaid. Please reconsult CSW if any other questions arise.   Shenae Bonanno, LCSW Clinical Social Worker  [facility] [employer]  [phone]  

## 2018-03-18 NOTE — Lactation Note (Signed)
This note was copied from a baby's chart. Lactation Consultation Note  Patient Name: Charlene Schneider NWGNF'AToday's Date: 03/18/2018   G1P1 unplanned csection delivery with 7 percent wt loss.  Infant 27 hours old. Mom reports infant has been sleepy and not fed much.  Urged mom to start pumping and hand expressing past bf and feeding back all EMM past feeds.  Urged mom to let another family member feed it back if someone was with her. Grandmother holding infant.  Mom reports infant just ate about 10 minutes ago.  Initiated using DEBP.  Mom reports she will go back to work in 12 weeks and has no pump yet.  Grandmother reports they think moms insurance will pay for one. Mom pumped and removed about 2 ml with pumping.  Infant started cuing.  Fed this back to infant via curved tip syringe.  Instructed mom how to to do this.  Fed to infant and then assisted with latching.  Infant bf on right side about 8 minutes then fell asleep.  Unable to rouse her/broke suction and took her off and she started cuing again.  Mom demo hand expression and fed 2 more ml back to infant and then relatched in cradle hold on right breast.  Mom did this with minimal assistance. Left mom and baby bf.  Urged mom to feed back (top off) at least 5-7 ml to infant past breastfeeds and more if she could get out and get infant to take Maternal Data    Feeding    LATCH Score                   Interventions    Lactation Tools Discussed/Used     Consult Status      Charlene Schneider 03/18/2018, 3:38 PM

## 2018-03-18 NOTE — Progress Notes (Addendum)
POSTOPERATIVE DAY # 1 S/P Primary LTCS for arrest of dilation, baby girl "Naomi"  IOL on 9/22 for GHTN  S:         Reports feeling tired, sore, and emotional.  Pt. Just met with CSW for support.  Pt's mom present for visit and supportive.   She denies HA, visual changes, RUQ/epigastric pain              Tolerating po intake, but reports decreased appetite / no nausea / no vomiting / no flatus / no BM  Denies dizziness, SOB, or CP             Bleeding is moderate             Pain controlled withMotrin and Oxycodone             Up ad lib / ambulatory/ voiding QS  Newborn breast feeding - reports baby is latching well   O:  VS: BP 118/72 (BP Location: Right Leg)   Pulse 85   Temp 97.8 F (36.6 C) (Oral)   Resp 20   Ht '5\' 8"'$  (1.727 m)   Wt 101.2 kg   LMP 06/29/2017   SpO2 98%   BMI 33.91 kg/m    LABS:               Recent Labs    03/17/18 1443 03/18/18 0619  WBC 20.3* 14.7*  HGB 10.8* 9.4*  PLT 293 274               Bloodtype: --/--/AB POS (09/22 0218)  Rubella: Immune (03/18 0000)                                             I&O: Intake/Output      09/23 0701 - 09/24 0700 09/24 0701 - 09/25 0700   P.O. 900    I.V. (mL/kg) 1500 (14.8)    Total Intake(mL/kg) 2400 (23.7)    Urine (mL/kg/hr) 3550 (1.5) 100 (0.2)   Blood 483    Total Output 4033 100   Net -1633 -100                     Physical Exam:             Alert and Oriented X3  Lungs: Clear and unlabored  Heart: regular rate and rhythm / no murmurs  Abdomen: soft, non-tender, non-distended, hypoactive bowel sounds             Fundus: firm, non-tender, U-1             Dressing: honeycomb dressing with steristrips >50% saturated with old sanguinous             Incision:  approximated with sutures / no erythema / no ecchymosis / old drainage noted  Perineum: intact  Lochia: small, no clots   Extremities: +1 BLE edema, no calf pain or tenderness,   A/P:        POD # 1 S/P Primary LTCS            Gestational  Hypertension    - BPs and labs stable, no neural s/s   - Recommend BP check in 1 week PP  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   - s/p CSW consult: see note for details   - Restart home meds: Wellbutrin SR  $'200mg'p$  BID, Lexapro '10mg'$  daily, and Buspar '15mg'$  BID    - At risk for postpartum depression, recommend 2 week interval PP visit   ABL Anemia    - Niferex '150mg'$  daily   - Magnesium oxide '400mg'$  daily   Routine postoperative care              May D/C IV after next void  Encouraged to rest when baby rests  See lactation today   May shower today  Change honeycomb dressing x 1 after shower  Warm liquids and ambulation to promote bowel motility   Anticipatory guidance for c/s recovery and cluster feeding discussed   Continue current care   Lars Pinks, MSN, CNM [facility]

## 2018-03-19 NOTE — Lactation Note (Signed)
This note was copied from a baby's chart. Lactation Consultation Note  Patient Name: Charlene Schneider ZOXWR'U Date: 03/19/2018 Reason for consult: Follow-up assessment;Early term 37-38.6wks;Infant weight loss  Visited with P1 Mom of ET baby at 90 hrs old.  Baby at 8.6% weight loss (up from 7% yesterday)  Weight loss from yesterday only down 1.6%.  Baby has had 5 voids, 0 stools last 24 hrs. (baby had 4 stools in first day)  Offered to assist and assess with baby's latch.  Mom prefers football hold.  Mom needing guidance with how to support her breast more at base of breast, so not to interfere with a deep areolar latch.  Reviewed hand expression, and transitional milk easily expressed and "pouring out".   Baby latched deeply, taught Mom the importance of maintaining breast support.  Mom taught how to do alternate breast compression to increase milk transfer.  Baby vigorous and swallowing regularly.  Baby fed for 15 mins and switched onto second breast.  Mom able to latch baby with a deep latch independently.    Plan- 1- Keep baby STS and offer breast on cue, at least every 3 hrs due to weight loss. 2- Latch baby deeply to breast using alternate breast compression. 3- pump both breasts 15 mins on initiation setting, or use hand pump to express breast milk to feed baby.  10-20 ml AFTER baby breastfeeds today recommended as guideline.   4- If baby unable to attain a deep latch, or too sleepy after trying to awaken baby, call for assistance.  Mom to double pump if baby unable to breastfeed at a feeding.  Wrote basic feeding plan on dry erase board.  Encouraged frequent breast feedings today.  Interventions Interventions: Breast feeding basics reviewed;Assisted with latch;Skin to skin;Breast massage;Hand express;Breast compression;Adjust position;Support pillows;Position options;Expressed milk;Hand pump;DEBP  Lactation Tools Discussed/Used Tools: Pump Breast pump type: Double-Electric Breast  Pump;Manual   Consult Status Consult Status: Follow-up Date: 03/20/18 Follow-up type: In-patient    Judee Clara 03/19/2018, 11:39 AM

## 2018-03-19 NOTE — Progress Notes (Signed)
Due to high hospital census and acuity, CSW is unable to meet with MOB to complete assessment for Edinburgh Score of 13. MOB will receive PMAD education and resources during discharge teaching.     Please consult CSW if concerns arise or by MOB's request.  Blaine Hamper, MSW, LCSW Clinical Social Work [phone]

## 2018-03-19 NOTE — Progress Notes (Signed)
Patient ID: Charlene Schneider, female   DOB: 1995-09-07, 22 y.o.   MRN: [id number]  Post Partum Day # 2, s/p primary cesarean section for arrest of dilation, Gestational hypertension, Anxiety/Depression, Anemia   Subjective:  Patient standing in bathroom, brushing her teeth. Infant being held at bedside by grandmother. FOB sitting in chair.   Reports nipple soreness and fatigue. Eating, drinking, and passing gas without difficulty.   Denies difficulty breathing or respiratory distress, chest pain, abdominal pain, excessive vaginal bleeding, dysuria, and leg pain or swelling.   Objective:  Temp:  [98.3 F (36.8 C)-98.4 F (36.9 C)] 98.4 F (36.9 C) (09/25 0602) Pulse Rate:  [89-101] 89 (09/25 0602) Resp:  [18] 18 (09/25 0602) BP: (129-130)/(66-68) 130/66 (09/25 0602) SpO2:  [97 %] 97 % (09/24 2212)  Physical Exam:   General: alert and cooperative   Lungs: clear to auscultation bilaterally  Breasts: normal appearance, no masses or tenderness  Heart: normal apical impulse  Abdomen: soft, non-tender; bowel sounds normal; no masses,  no organomegaly; incision covered by dressing-clean, dry, intact  Pelvis: Lochia: appropriate, Uterine Fundus: firm  Extremities: DVT Evaluation: Nn evidence of DVT seen on physical exam.  Recent Labs    03/17/18 1443 03/18/18 0619  HGB 10.8* 9.4*  HCT 31.6* 28.0*    Assessment:  22 year old G1P1, Post Partum Day # 2, s/p primary cesarean section for arrest of dilation, Gestational hypertension, Anxiety/Depression, Anemia  Breastfeeding  Plan:  Discussed breast care techniques; soft gels given.   Routine postpartum care and education.   Encouraged ambulation.   Extensive education regarding pain management options; patient and family verbalized understanding.   Reviewed red flag symptoms and when to call.   Continue orders as written. Reassess as needed.   Anticipate discharge tomorrow. Will need one (1) week blood pressure check.     LOS: 3 days    Gunnar Bulla, CNM [facility] 03/19/2018 12:18 PM

## 2018-03-19 NOTE — Lactation Note (Addendum)
This note was copied from a baby's chart. Lactation Consultation Note  Patient Name: Charlene Schneider RUEAV'WToday's Date: 03/19/2018 Reason for consult: Follow-up assessment;Early term 37-38.6wks  37 hr female, ETI, weigh loss 7% Per parents,infant had 4 wet and 1 soiled diaper. Per mom, recently started using DEBP, prefers to hand express doesn't like DEBP, LC suggested try cylinder medela hand pump and mom expressed 6 ml of colostrum using hand pump. The 6 ml EBM was given to infant by dad using curve tip syringe. Per mom, she just started using pump late yesterday evening as [facility] but not been consistent in pumping.  Reviewed infant feeding behaviors and policy for ETI, discussed EBM and formula if need based on age / hours of birth. Infant is now 8737 hours old according  guidelines is 10-20 ml /24 hours. Mom BF infant 15 minutes on right  using cross-cradle position, infant given back 6 ml of EBM, mom prefers not to supplement at this time with  the 4 ml of formula, parents  will wait until infant weigh at 5 am,  then decided if want supplement w/ formula if infant is continuing to  losing more weight.   Maternal grandmother  informed LC  That family was not aware of infant feeding policy for ETI nor was not clear on infant feeding guidelines.   Mom's plan :  1. BF infant 15-20 minutes. 2. Will use hand pump, hand express or DEBP and give infant back EBM. 3. Further discuss about  plans for supplementation if EBM is not according to volume  based on infant's age  per hours of birth, may need supplement w/formula  Maternal Data    Feeding Feeding Type: Breast Fed Length of feed: 25 min  LATCH Score Latch: Grasps breast easily, tongue down, lips flanged, rhythmical sucking.  Audible Swallowing: Spontaneous and intermittent  Type of Nipple: Everted at rest and after stimulation  Comfort (Breast/Nipple): Soft / non-tender  Hold (Positioning): Assistance needed to correctly  position infant at breast and maintain latch.  LATCH Score: 9  Interventions Interventions: Assisted with latch;Skin to skin;Support pillows;Adjust position;Breast massage;Expressed milk;Hand express;Hand pump  Lactation Tools Discussed/Used     Consult Status Consult Status: Follow-up Date: 03/19/18 Follow-up type: In-patient    Charlene EarthlyRobin Oluwaseyi Schneider 03/19/2018, 1:37 AM

## 2018-03-20 ENCOUNTER — Encounter (HOSPITAL_COMMUNITY): Payer: Self-pay | Admitting: *Deleted

## 2018-03-20 DIAGNOSIS — O9902 Anemia complicating childbirth: Secondary | ICD-10-CM | POA: Diagnosis present

## 2018-03-20 DIAGNOSIS — F418 Other specified anxiety disorders: Secondary | ICD-10-CM | POA: Diagnosis present

## 2018-03-20 MED ORDER — POLYSACCHARIDE IRON COMPLEX 150 MG PO CAPS: 150.0000 mg | ORAL_CAPSULE | Freq: Every day | ORAL | Status: AC

## 2018-03-20 MED ORDER — SIMETHICONE 80 MG PO CHEW: 80.0000 mg | CHEWABLE_TABLET | ORAL | 0 refills | Status: DC | PRN

## 2018-03-20 MED ORDER — OXYCODONE HCL 10 MG PO TABS: 10.0000 mg | ORAL_TABLET | ORAL | 0 refills | Status: AC | PRN

## 2018-03-20 MED ORDER — IBUPROFEN 600 MG PO TABS: 600.0000 mg | ORAL_TABLET | Freq: Four times a day (QID) | ORAL | 0 refills | Status: AC

## 2018-03-20 MED ORDER — COCONUT OIL OIL: 1.0000 "application " | TOPICAL_OIL | 0 refills | Status: AC | PRN

## 2018-03-20 MED ORDER — SENNOSIDES-DOCUSATE SODIUM 8.6-50 MG PO TABS: 2.0000 | ORAL_TABLET | ORAL | Status: DC

## 2018-03-20 NOTE — Lactation Note (Signed)
This note was copied from a baby's chart. Lactation Consultation Note; Mom reports baby has just finished feeding for 10 min. Is asleep in mom's arms at present. Reports breasts are feeling fuller this morning and she hears lots of swallows while baby is nursing. Reports she pumped after every feeding yesterday. Plans to order pump from insurance company. Will use manual pump until she can get one. Baby has gained weight but no stool in greater than 48 hours. Mom reports nipples are tender. They look intact. No questions at present. Reviewed our phone number, OP appointments and BFSG as resources for support after DC. To call prn. Patient Name: Charlene Schneider ZOXWR'U Date: 03/20/2018 Reason for consult: Follow-up assessment;Early term 37-38.6wks   Maternal Data Formula Feeding for Exclusion: No Has patient been taught Hand Expression?: Yes Does the patient have breastfeeding experience prior to this delivery?: No  Feeding Feeding Type: Breast Fed  LATCH Score                   Interventions    Lactation Tools Discussed/Used WIC Program: No   Consult Status Consult Status: Complete    Pamelia Hoit 03/20/2018, 8:58 AM

## 2018-03-20 NOTE — Discharge Summary (Signed)
OB Discharge Summary  Patient Name: Charlene Schneider DOB: [birth year] MRN: [id number]  Date of admission: 03/16/2018 Delivering MD: Noland Fordyce   Date of discharge: 03/20/2018  Admitting diagnosis: 37wks blood pressure is high Intrauterine pregnancy: [redacted]w[redacted]d     Secondary diagnosis:Principal Problem:   Postpartum care following cesarean delivery (9/23) Active Problems:   S/P cesarean section: indication: arrest of dilation    Gestational hypertension   Maternal anemia, with delivery   Depression with anxiety     Discharge diagnosis:  Patient Active Problem List   Diagnosis Date Noted  . Maternal anemia, with delivery 03/20/2018  . Depression with anxiety 03/20/2018  . S/P cesarean section: indication: arrest of dilation  03/17/2018  . Gestational hypertension 03/17/2018  . Postpartum care following cesarean delivery (9/23) 03/17/2018                                                                   Post partum procedures:none  Pain control: Epidural   Complications: None  Hospital course:  Induction of Labor With Cesarean Section  22 y.o. yo G1P0 at [redacted]w[redacted]d was admitted to the hospital 03/16/2018 for induction of labor. Patient had a labor course significant for arrest of dilation at 9 cm. The patient went for cesarean section due to Arrest of Dilation, and delivered a Viable infant,03/17/2018  Membrane Rupture Time/Date: 12:09 AM ,03/17/2018   Details of operation can be found in separate operative Note.  Patient had an uncomplicated postpartum course. She is ambulating, tolerating a regular diet, passing flatus, and urinating well.  Patient is discharged home in stable condition on 03/20/18.  Social work consult completed inpatient due to significant mental health disease of patient. Good social support from patient's mother.                             Physical exam  Vitals:   03/19/18 0602 03/19/18 1450 03/19/18 2210 03/20/18 0600  BP: 130/66 (!) 134/94 (!) 103/59 118/79   Pulse: 89 93 88 83  Resp: 18 18 18 18   Temp: 98.4 F (36.9 C) 98.8 F (37.1 C) 98.4 F (36.9 C) 98 F (36.7 C)  TempSrc: Oral Oral Oral Oral  SpO2:  98%    Weight:      Height:       General: alert, cooperative and no distress Lochia: appropriate Uterine Fundus: firm Incision: Healing well with no significant drainage DVT Evaluation: No cords or calf tenderness. No significant calf/ankle edema. Labs: Lab Results  Component Value Date   WBC 14.7 (H) 03/18/2018   HGB 9.4 (L) 03/18/2018   HCT 28.0 (L) 03/18/2018   MCV 84.1 03/18/2018   PLT 274 03/18/2018   CMP Latest Ref Rng & Units 03/16/2018  Glucose 70 - 99 mg/dL 96  BUN 6 - 20 mg/dL 7  Creatinine 4.69 - 6.29 mg/dL 5.28  Sodium 413 - 244 mmol/L 134(L)  Potassium 3.5 - 5.1 mmol/L 4.0  Chloride 98 - 111 mmol/L 103  CO2 22 - 32 mmol/L 22  Calcium 8.9 - 10.3 mg/dL 0.1(U)  Total Protein 6.5 - 8.1 g/dL 6.9  Total Bilirubin 0.3 - 1.2 mg/dL 0.4  Alkaline Phos 38 - 126 U/L 149(H)  AST 15 - 41  U/L 21  ALT 0 - 44 U/L 19    Discharge instruction: per After Visit Summary and "Baby and Me Booklet".  After Visit Meds:  Allergies as of 03/20/2018   No Known Allergies     Medication List    STOP taking these medications   labetalol 100 MG tablet Commonly known as:  NORMODYNE   labetalol 200 MG tablet Commonly known as:  NORMODYNE   ondansetron 4 MG disintegrating tablet Commonly known as:  ZOFRAN-ODT     TAKE these medications   acetaminophen 500 MG tablet Commonly known as:  TYLENOL Take 1,000 mg by mouth every 6 (six) hours as needed for mild pain or headache.   ALPRAZolam 0.25 MG tablet Commonly known as:  XANAX Take 0.25 mg by mouth daily as needed for anxiety.   buPROPion 200 MG 12 hr tablet Commonly known as:  WELLBUTRIN SR Take 200 mg by mouth 2 (two) times daily.   busPIRone 15 MG tablet Commonly known as:  BUSPAR Take 15 mg by mouth 2 (two) times daily.   butalbital-acetaminophen-caffeine  50-325-40 MG tablet Commonly known as:  FIORICET, ESGIC Take 2 tablets by mouth every 6 (six) hours as needed for headache.   coconut oil Oil Apply 1 application topically as needed.   escitalopram 10 MG tablet Commonly known as:  LEXAPRO Take 10 mg by mouth daily.   ibuprofen 600 MG tablet Commonly known as:  ADVIL,MOTRIN Take 1 tablet (600 mg total) by mouth every 6 (six) hours.   iron polysaccharides 150 MG capsule Commonly known as:  NIFEREX Take 1 capsule (150 mg total) by mouth daily. Start taking on:  03/21/2018   Magnesium 500 MG Caps Take 1 capsule (500 mg total) by mouth daily.   nicotine polacrilex 4 MG gum Commonly known as:  NICORETTE Take 4 mg by mouth as needed for smoking cessation.   Oxycodone HCl 10 MG Tabs Take 1 tablet (10 mg total) by mouth every 4 (four) hours as needed (pain scale > 7).   prenatal multivitamin Tabs tablet Take 1 tablet by mouth daily at 12 noon.   promethazine 25 MG tablet Commonly known as:  PHENERGAN Take 1 tablet (25 mg total) by mouth every 6 (six) hours as needed for nausea or vomiting.   senna-docusate 8.6-50 MG tablet Commonly known as:  Senokot-S Take 2 tablets by mouth daily. Start taking on:  03/21/2018   simethicone 80 MG chewable tablet Commonly known as:  MYLICON Chew 1 tablet (80 mg total) by mouth as needed for flatulence.       Diet: routine diet  Activity: Advance as tolerated. Pelvic rest for 6 weeks.   Postpartum contraception: Not Discussed  Newborn Data: Live born female named Janelle Floor Birth Weight: 6 lb 12.5 oz (3075 g) APGAR: 9, 9  Newborn Delivery   Birth date/time:  03/17/2018 11:50:00 Delivery type:  C-Section, Low Transverse C-section categorization:  Primary     Baby Feeding: Breast Disposition:home with mother   Delivery Report:  Review the Delivery Report for details.    Follow up: Follow-up Information    Noland Fordyce, MD. Schedule an appointment as soon as possible for a  visit in 1 week(s).   Specialty:  Obstetrics and Gynecology Why:  BP check Contact information: [address] [place] [address] [phone]           Signed: Neta Mends, CNM, MSN 03/20/2018, 10:25 AM

## 2018-03-20 NOTE — Progress Notes (Signed)
Patient ID: Charlene Schneider, female   DOB: [birth year], 22 y.o.   MRN: [id number]  Subjective: POD# 3 primary cesarean section for arrest of dilation, Gestational hypertension, Anxiety/Depression, Anemia  Information for the patient's newborn:  Lindi, Abram [[id number]]  female   Baby name: Janelle Floor  Reports feeling well, ready for DC home.  Feeding: breast Patient reports tolerating PO.  Breast symptoms:good latch, baby w/ gain. Pain controlled with PO meds Denies HA/SOB/C/P/N/V/dizziness. Flatus preent. She reports vaginal bleeding as normal, without clots.  She is ambulating, urinating without difficulty.     Objective:   VS:    Vitals:   03/19/18 0602 03/19/18 1450 03/19/18 2210 03/20/18 0600  BP: 130/66 (!) 134/94 (!) 103/59 118/79  Pulse: 89 93 88 83  Resp: 18 18 18 18   Temp: 98.4 F (36.9 C) 98.8 F (37.1 C) 98.4 F (36.9 C) 98 F (36.7 C)  TempSrc: Oral Oral Oral Oral  SpO2:  98%    Weight:      Height:        No intake or output data in the 24 hours ending 03/20/18 0845      Recent Labs    03/17/18 1443 03/18/18 0619  WBC 20.3* 14.7*  HGB 10.8* 9.4*  HCT 31.6* 28.0*  PLT 293 274     Blood type: --/--/AB POS (09/22 0218)  Rubella: Immune (03/18 0000)     Physical Exam:  General: alert, cooperative and no distress Abdomen: soft, nontender, normal bowel sounds Incision: clean, dry and intact Uterine Fundus: firm, below umbilicus, nontender Lochia: minimal Ext: no edema, redness or tenderness in the calves or thighs      Assessment/Plan: 22 y.o.   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

## 2018-03-23 ENCOUNTER — Inpatient Hospital Stay (HOSPITAL_COMMUNITY): Admission: RE | Admit: 2018-03-23 | Payer: BLUE CROSS/BLUE SHIELD | Source: Ambulatory Visit

## 2018-03-23 ENCOUNTER — Inpatient Hospital Stay (HOSPITAL_COMMUNITY): Payer: BLUE CROSS/BLUE SHIELD

## 2018-04-15 ENCOUNTER — Encounter

## 2018-04-15 ENCOUNTER — Ambulatory Visit: Payer: BLUE CROSS/BLUE SHIELD | Admitting: Neurology

## 2018-06-10 ENCOUNTER — Ambulatory Visit: Payer: BLUE CROSS/BLUE SHIELD | Admitting: Diagnostic Neuroimaging

## 2018-06-10 ENCOUNTER — Encounter: Payer: Self-pay | Admitting: Diagnostic Neuroimaging

## 2018-06-10 VITALS — BP 128/86 | HR 80 | Ht 68.0 in | Wt 193.2 lb

## 2018-06-10 DIAGNOSIS — G43109 Migraine with aura, not intractable, without status migrainosus: Secondary | ICD-10-CM | POA: Diagnosis not present

## 2018-06-10 MED ORDER — GALCANEZUMAB-GNLM 120 MG/ML ~~LOC~~ SOSY: 120.0000 mg | PREFILLED_SYRINGE | SUBCUTANEOUS | 4 refills | Status: AC

## 2018-06-10 MED ORDER — RIZATRIPTAN BENZOATE 10 MG PO TBDP: 10.0000 mg | ORAL_TABLET | ORAL | 11 refills | Status: AC | PRN

## 2018-06-10 NOTE — Progress Notes (Signed)
[facility]  PATIENT: Charlene Schneider DOB: May 04, 1996  REFERRING CLINICIAN: Fogelman HISTORY FROM: patient  REASON FOR VISIT: follow up   HISTORICAL  CHIEF COMPLAINT:  Chief Complaint  Patient presents with  . Migraine    rm 7, "headaches have come back recently, taking Tylenol, Ibuprofen 600 mg as needed without much relief"  . Follow-up    3 month    HISTORY OF PRESENT ILLNESS:   UPDATE (06/10/18, VRP): Since last visit, doing better with HA after delivery; then worse HA in last 1 month. Symptoms are increasing. Severity is moderate. No alleviating or aggravating factors. Using tylenol and ibuprofen.   PRIOR HPI (02/25/18): 22 year old female here for evaluation of headaches.  Patient is currently [redacted] weeks pregnant, with estimated due date April 05, 2018.  Patient has history of headaches since age 44 years old with sharp pounding sensation and nausea, vomiting, seeing spots of light.  Headaches can last hours or days at a time.  Since pregnancy headaches have continued.  They feel like her typical migraine headaches.  She is averaging 3-4 headaches per week.  Patient was recently diagnosed with hypertension, now started on labetalol.  Preeclampsia was ruled out.  Patient also has been trying Tylenol, Fioricet and Phenergan as needed.  Patient has had concussion x4 in the past, with last concussion in 2017.  Patient states that her concussion symptoms in the past have fully resolved.   REVIEW OF SYSTEMS: Full 14 system review of systems performed and negative with exception of: dizziness.   ALLERGIES: No Known Allergies  HOME MEDICATIONS: Outpatient Medications Prior to Visit  Medication Sig Dispense Refill  . acetaminophen (TYLENOL) 500 MG tablet Take 1,000 mg by mouth every 6 (six) hours as needed for mild pain or headache.    . ALPRAZolam (XANAX) 0.25 MG tablet Take 0.25 mg by mouth daily as needed for anxiety.   0  . busPIRone (BUSPAR) 15 MG  tablet Take 15 mg by mouth 2 (two) times daily.    . coconut oil OIL Apply 1 application topically as needed.  0  . escitalopram (LEXAPRO) 10 MG tablet Take 10 mg by mouth daily.  1  . ibuprofen (ADVIL,MOTRIN) 600 MG tablet Take 1 tablet (600 mg total) by mouth every 6 (six) hours. 30 tablet 0  . iron polysaccharides (NIFEREX) 150 MG capsule Take 1 capsule (150 mg total) by mouth daily.    . Magnesium 500 MG CAPS Take 1 capsule (500 mg total) by mouth daily. 30 capsule 0  . PARoxetine (PAXIL) 10 MG tablet Take 10 mg by mouth daily. Unsure of dose    . Prenatal Vit-Fe Fumarate-FA (PRENATAL MULTIVITAMIN) TABS tablet Take 1 tablet by mouth daily at 12 noon.    . promethazine (PHENERGAN) 25 MG tablet Take 1 tablet (25 mg total) by mouth every 6 (six) hours as needed for nausea or vomiting. 30 tablet 0  . butalbital-acetaminophen-caffeine (FIORICET, ESGIC) 50-325-40 MG tablet Take 2 tablets by mouth every 6 (six) hours as needed for headache.    . nicotine polacrilex (NICORETTE) 4 MG gum Take 4 mg by mouth as needed for smoking cessation.    Marland Kitchen oxyCODONE 10 MG TABS Take 1 tablet (10 mg total) by mouth every 4 (four) hours as needed (pain scale > 7). (Patient not taking: Reported on 06/10/2018) 30 tablet 0  . buPROPion (WELLBUTRIN SR) 200 MG 12 hr tablet Take 200 mg by mouth 2 (two) times daily.  2  . senna-docusate (SENOKOT-S)  8.6-50 MG tablet Take 2 tablets by mouth daily.    . simethicone (MYLICON) 80 MG chewable tablet Chew 1 tablet (80 mg total) by mouth as needed for flatulence. 30 tablet 0   No facility-administered medications prior to visit.     PAST MEDICAL HISTORY: Past Medical History:  Diagnosis Date  . Anxiety   . Arachnoid cyst   . Chiari malformation type I (HCC)   . Hypertension   . Migraines   . Transient hypertension of pregnancy, third trimester     PAST SURGICAL HISTORY: Past Surgical History:  Procedure Laterality Date  . CESAREAN SECTION N/A 03/17/2018   Procedure:  CESAREAN SECTION;  Surgeon: Noland FordyceFogleman, Kelly, MD;  Location: [facility];  Service: Obstetrics;  Laterality: N/A;  . NO PAST SURGERIES      FAMILY HISTORY: Family History  Problem Relation Age of Onset  . Cirrhosis Maternal Grandmother   . Thyroid disease Maternal Grandmother   . Depression Mother     SOCIAL HISTORY: Social History   Socioeconomic History  . Marital status: Single    Spouse name: Not on file  . Number of children: 1  . Years of education: Not on file  . Highest education level: Not on file  Occupational History    Comment: CNA  Social Needs  . Financial resource strain: Not on file  . Food insecurity:    Worry: Not on file    Inability: Not on file  . Transportation needs:    Medical: Not on file    Non-medical: Not on file  Tobacco Use  . Smoking status: Current Every Day Smoker    Packs/day: 0.10    Types: Cigarettes  . Smokeless tobacco: Never Used  . Tobacco comment: 1 per day  Substance and Sexual Activity  . Alcohol use: No    Frequency: Never  . Drug use: No  . Sexual activity: Yes  Lifestyle  . Physical activity:    Days per week: Not on file    Minutes per session: Not on file  . Stress: Not on file  Relationships  . Social connections:    Talks on phone: Not on file    Gets together: Not on file    Attends religious service: Not on file    Active member of club or organization: Not on file    Attends meetings of clubs or organizations: Not on file    Relationship status: Not on file  . Intimate partner violence:    Fear of current or ex partner: Not on file    Emotionally abused: Not on file    Physically abused: Not on file    Forced sexual activity: Not on file  Other Topics Concern  . Not on file  Social History Narrative   Lives home with parents.  Works at [employer].  Education:   Some college.  Some caffeine.      PHYSICAL EXAM  GENERAL EXAM/CONSTITUTIONAL: Vitals:  Vitals:   06/10/18 1443  BP:  128/86  Pulse: 80  Weight: 193 lb 3.2 oz (87.6 kg)  Height: 5\' 8"  (1.727 m)   Body mass index is 29.38 kg/m. Wt Readings from Last 3 Encounters:  06/10/18 193 lb 3.2 oz (87.6 kg)  03/16/18 223 lb (101.2 kg)  03/03/18 221 lb (100.2 kg)    Patient is in no distress; well developed, nourished and groomed; neck is supple   CARDIOVASCULAR:  Examination of carotid arteries is normal; no carotid bruits  Regular rate and rhythm, no murmurs  Examination of peripheral vascular system by observation and palpation is normal  EYES:  Ophthalmoscopic exam of optic discs and posterior segments is normal; no papilledema or hemorrhages No exam data present  MUSCULOSKELETAL:  Gait, strength, tone, movements noted in Neurologic exam below  NEUROLOGIC: MENTAL STATUS:  No flowsheet data found.  awake, alert, oriented to person, place and time  recent and remote memory intact  normal attention and concentration  language fluent, comprehension intact, naming intact  fund of knowledge appropriate  CRANIAL NERVE:   2nd - no papilledema on fundoscopic exam  2nd, 3rd, 4th, 6th - pupils equal and reactive to light, visual fields full to confrontation, extraocular muscles intact, no nystagmus  5th - facial sensation symmetric  7th - facial strength symmetric  8th - hearing intact  9th - palate elevates symmetrically, uvula midline  11th - shoulder shrug symmetric  12th - tongue protrusion midline  MOTOR:   normal bulk and tone, full strength in the BUE, BLE  SENSORY:   normal and symmetric to light touch  COORDINATION:   finger-nose-finger, fine finger movements normal  REFLEXES:   deep tendon reflexes present and symmetric  GAIT/STATION:   narrow based gait     DIAGNOSTIC DATA (LABS, IMAGING, TESTING) - I reviewed patient records, labs, notes, testing and imaging myself where available.  Lab Results  Component Value Date   WBC 14.7 (H) 03/18/2018   HGB  9.4 (L) 03/18/2018   HCT 28.0 (L) 03/18/2018   MCV 84.1 03/18/2018   PLT 274 03/18/2018      Component Value Date/Time   NA 134 (L) 03/16/2018 1209   K 4.0 03/16/2018 1209   CL 103 03/16/2018 1209   CO2 22 03/16/2018 1209   GLUCOSE 96 03/16/2018 1209   BUN 7 03/16/2018 1209   CREATININE 0.46 03/16/2018 1209   CALCIUM 8.8 (L) 03/16/2018 1209   PROT 6.9 03/16/2018 1209   ALBUMIN 3.0 (L) 03/16/2018 1209   AST 21 03/16/2018 1209   ALT 19 03/16/2018 1209   ALKPHOS 149 (H) 03/16/2018 1209   BILITOT 0.4 03/16/2018 1209   GFRNONAA >60 03/16/2018 1209   GFRAA >60 03/16/2018 1209   No results found for: CHOL, HDL, LDLCALC, LDLDIRECT, TRIG, CHOLHDL No results found for: ZOXW9U No results found for: VITAMINB12 No results found for: TSH   07/05/16 MRI brain (without) [report only] - normal      ASSESSMENT AND PLAN  22 y.o. year old female here with history of migraine headaches with aura since age 33 years old, with continued migraine headaches during pregnancy.  Neurologic examination is unremarkable.  Headaches have similar features to her prior migraine headaches.  Meds tried: topiramate, labetalol, sumatriptan; cannot take depakote or TCAs due to current buspar use   Dx: migraine  1. Migraine with aura and without status migrainosus, not intractable      PLAN:  MIGRAINE WITH AURA - patient planning to change from breast feeding to bottle feeding in next 1 month; then will start:  - start emgality for migraine prevention - rizatriptan 10mg  as needed for breakthrough headache; may repeat x 1 after 2 hours; max 2 tabs per day or 8 per month - ibuprofen / tylenol as needed  - phenergan as needed for nausea  Return in about 6 months (around 12/10/2018) for with NP.    Suanne Marker, MD 06/10/2018, 3:40 PM Certified in Neurology, Neurophysiology and Neuroimaging  [facility] [address] [place], [place] [address] [phone]

## 2018-06-10 NOTE — Patient Instructions (Signed)
MIGRAINE WITH AURA - patient planning to change from breast feeding to bottle feeding in next 1 month; then will start:  - start emgality for migraine prevention - rizatriptan 10mg  as needed for breakthrough headache; may repeat x 1 after 2 hours; max 2 tabs per day or 8 per month - ibuprofen / tylenol as needed  - phenergan as needed for nausea

## 2018-06-30 ENCOUNTER — Encounter: Payer: Self-pay | Admitting: Diagnostic Neuroimaging

## 2018-06-30 ENCOUNTER — Telehealth: Payer: Self-pay | Admitting: Diagnostic Neuroimaging

## 2018-06-30 NOTE — Telephone Encounter (Signed)
Spoke to pt.   She has had migraines since 06/27/18. Level 8. Has not really let us even with tylenol or motrin (alternating).  Has nausea/vomiting.  Takes phenergan which helps some.  Is still breast feeding, stated (has stored breastmilk) is needs to come in for infusion.  When pregnant was given fiorcet (which helped sometimes, and if not would go to [facility] and have migrain cocktail ( steroid benadryl/ ?) and sometimes given percocet.  Mom would be able to drive her or stay with her if meds made her drowsy.  Please advise.

## 2018-06-30 NOTE — Telephone Encounter (Signed)
Pt requesting a call stating she would like to discuss coming in for an appt or having medication called in for her. Stating she has had a migraine since Friday 06/27/18 and nothing seems to help, please advise

## 2018-06-30 NOTE — Telephone Encounter (Signed)
MD order sheet on desk.   Dr. Marjory Lies ordered toradol 30mg  iv and compazine 10mg  IV.  Checked with tina in intrafusion, ok to come in now.  She will come in.

## 2018-08-21 ENCOUNTER — Encounter: Payer: Self-pay | Admitting: Family Medicine

## 2018-12-10 ENCOUNTER — Ambulatory Visit: Payer: BLUE CROSS/BLUE SHIELD | Admitting: Family Medicine

## 2019-08-31 IMAGING — US US OB TRANSVAGINAL
1 series · 15 of 28 positions shown · non-contrast
Comparison: None.

CLINICAL DATA: 21-year-old pregnant female with vaginal bleeding.
LMP: 06/29/2017 corresponding to an estimated gestational age of 6
weeks, 6 days.

EXAM:
OBSTETRIC <14 WK US AND TRANSVAGINAL OB US
TECHNIQUE: Both transabdominal and transvaginal ultrasound examinations were
performed for complete evaluation of the gestation as well as the
maternal uterus, adnexal regions, and pelvic cul-de-sac.
Transvaginal technique was performed to assess early pregnancy.

[Series 1: us ob transvaginal · 15 of 69 slices shown]
[im 1/69]
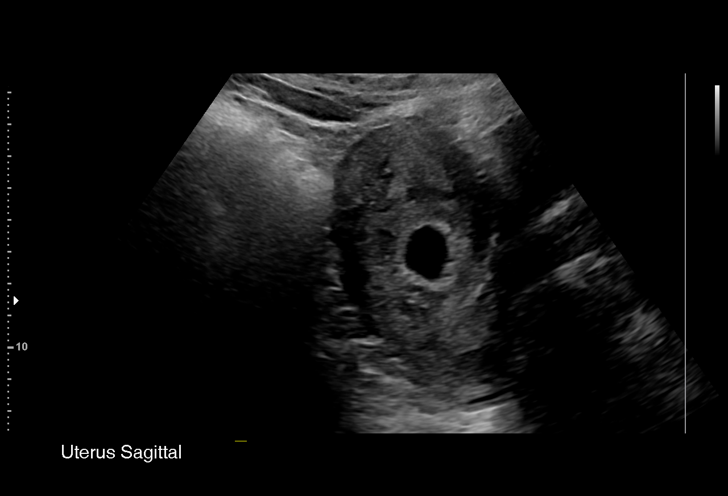
[im 6/69]
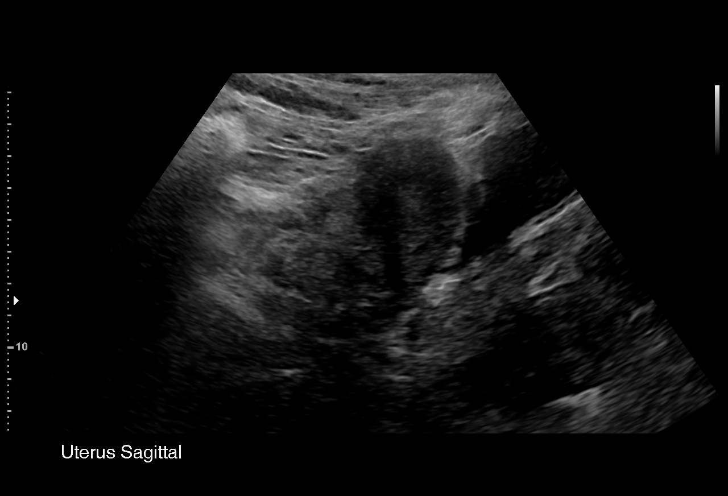
[im 11/69]
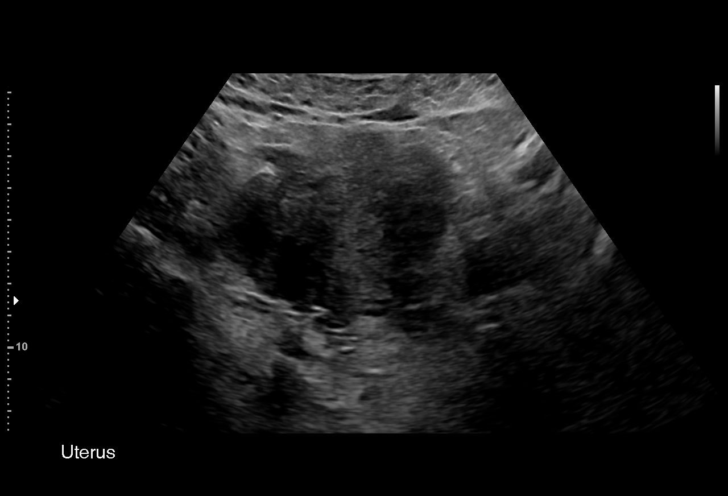
[im 16/69]
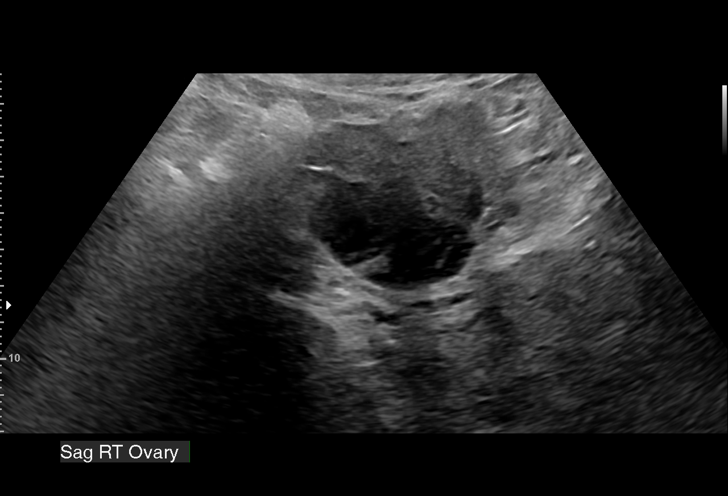
[im 21/69]
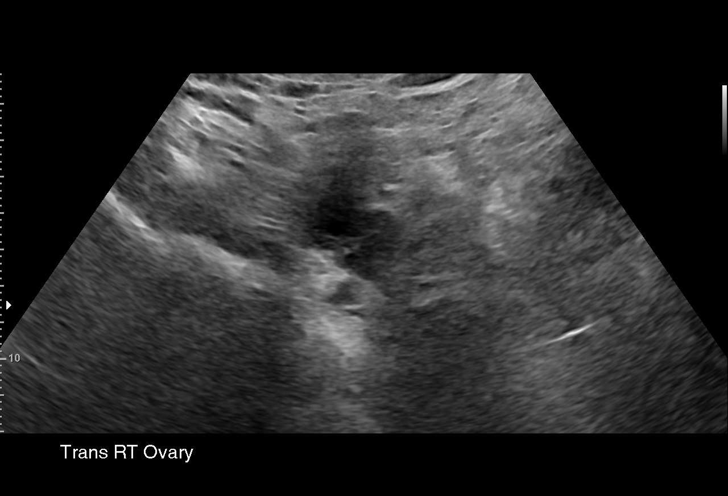
[im 26/69]
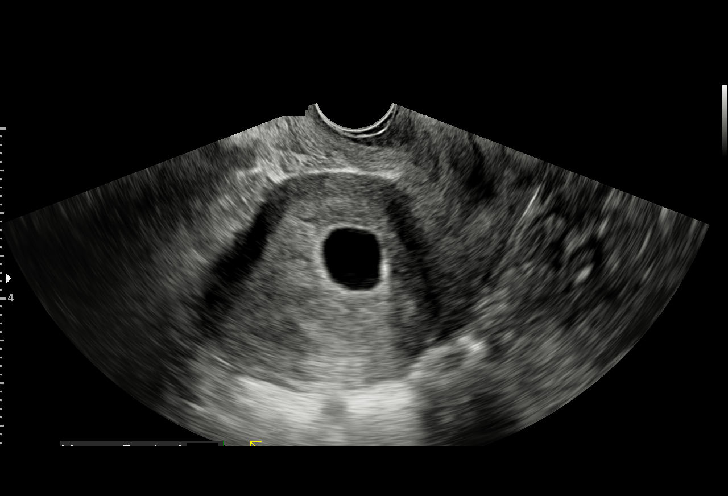
[im 31/69]
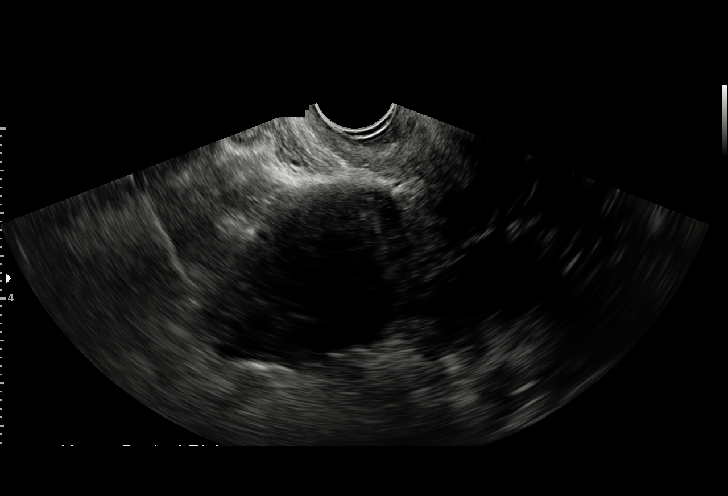
[im 36/69]
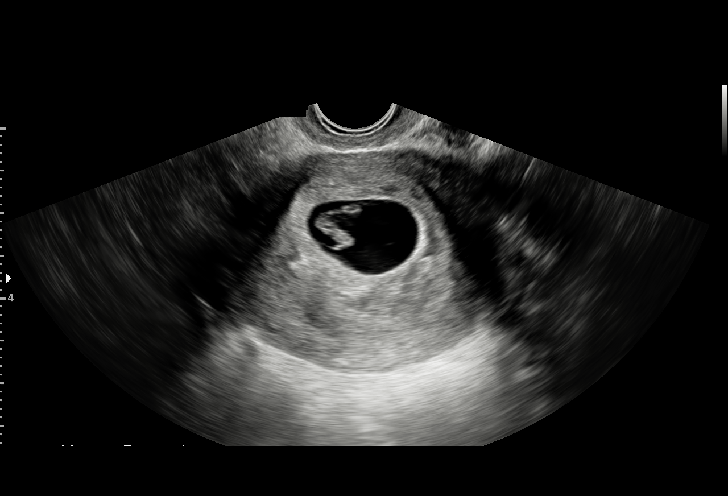
[im 38/69]
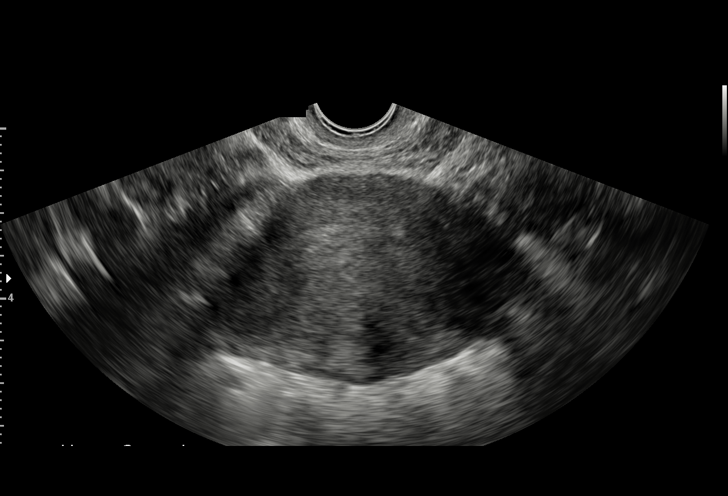
[im 43/69]
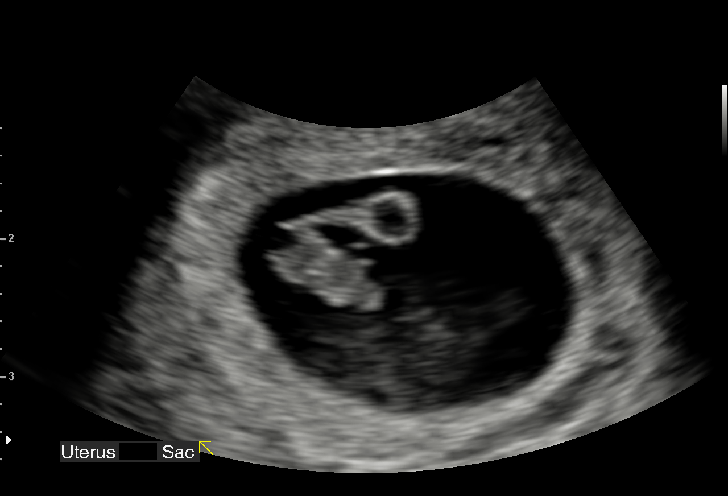
[im 48/69]
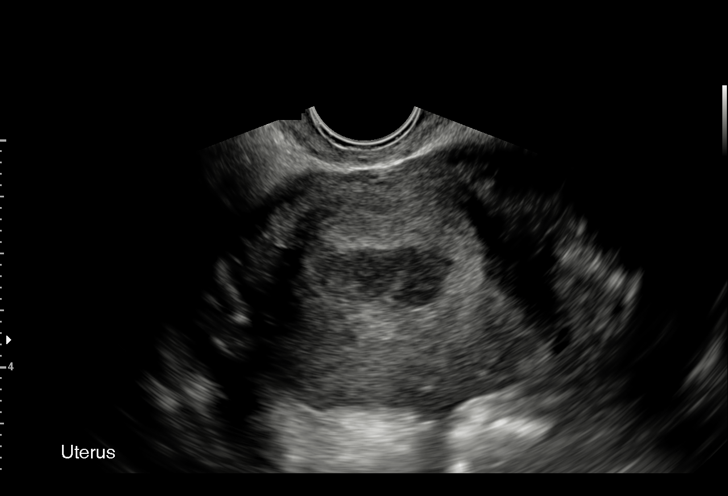
[im 53/69]
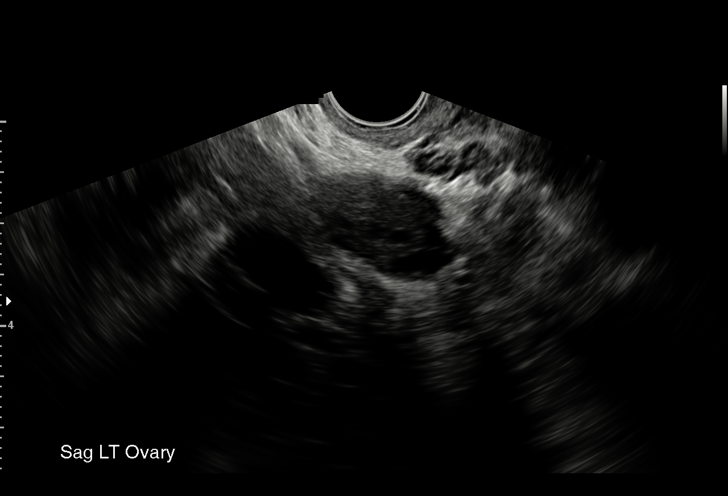
[im 58/69]
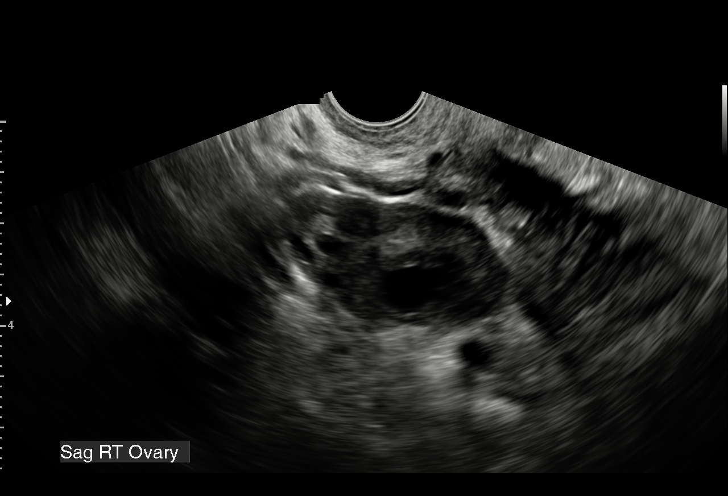
[im 63/69]
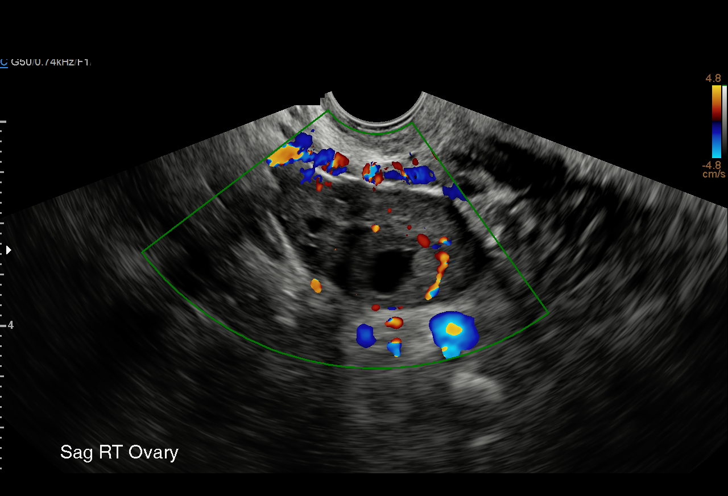
[im 69/69]
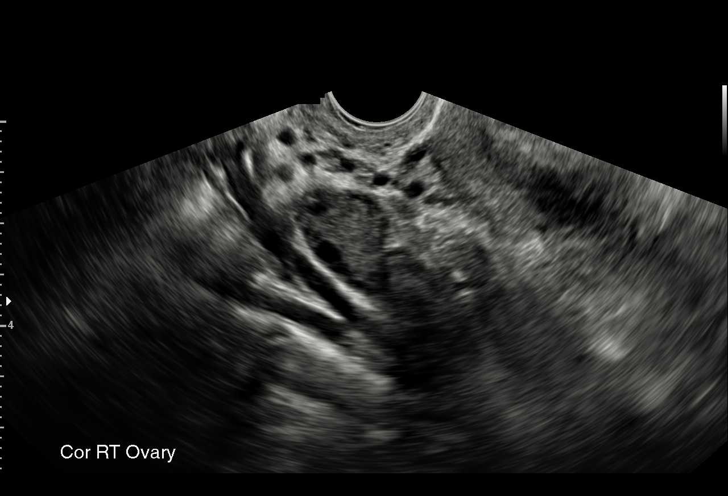

[15 of 28 positions shown; findings below may reference images not displayed]

FINDINGS: Intrauterine gestational sac: Single intrauterine gestational sac.

Yolk sac:  Seen

Embryo:  Present

Cardiac Activity: Detected

Heart Rate: 140 bpm

CRL: 9.6 mm   7 w   0 d                  US EDC: 04/04/2018

Subchorionic hemorrhage: Small subchorionic hemorrhage measuring
x 1.1 x 1.3 cm along the inferior gestational sac.

Maternal uterus/adnexae: The maternal ovaries appear unremarkable.
There is a small right ovarian corpus luteum.

There is trace free fluid within the pelvis.
IMPRESSION: Single live intrauterine pregnancy with an estimated gestational age
of 7 weeks, 0 days.

Small subchorionic hemorrhage
